# Patient Record
Sex: Female | Born: 1970 | Race: Black or African American | Hispanic: No | Marital: Single | Smoking: Former smoker
Health system: Southern US, Community
[De-identification: ages and names within clinical notes are randomized; demographics above are authoritative.]

## PROBLEM LIST (undated history)

## (undated) DIAGNOSIS — E039 Hypothyroidism, unspecified: Secondary | ICD-10-CM

## (undated) DIAGNOSIS — L309 Dermatitis, unspecified: Secondary | ICD-10-CM

## (undated) DIAGNOSIS — J302 Other seasonal allergic rhinitis: Secondary | ICD-10-CM

## (undated) DIAGNOSIS — O039 Complete or unspecified spontaneous abortion without complication: Secondary | ICD-10-CM

## (undated) DIAGNOSIS — L659 Nonscarring hair loss, unspecified: Secondary | ICD-10-CM

## (undated) DIAGNOSIS — D219 Benign neoplasm of connective and other soft tissue, unspecified: Secondary | ICD-10-CM

## (undated) HISTORY — DX: Dermatitis, unspecified: L30.9

## (undated) HISTORY — DX: Other seasonal allergic rhinitis: J30.2

## (undated) HISTORY — DX: Nonscarring hair loss, unspecified: L65.9

## (undated) HISTORY — DX: Hypothyroidism, unspecified: E03.9

## (undated) HISTORY — DX: Benign neoplasm of connective and other soft tissue, unspecified: D21.9

---

## 2008-09-20 ENCOUNTER — Other Ambulatory Visit: Payer: Self-pay | Admitting: Emergency Medicine

## 2008-09-21 ENCOUNTER — Inpatient Hospital Stay (HOSPITAL_COMMUNITY): Admission: AD | Admit: 2008-09-21 | Discharge: 2008-09-21 | Payer: Self-pay | Admitting: Obstetrics & Gynecology

## 2008-09-26 ENCOUNTER — Inpatient Hospital Stay (HOSPITAL_COMMUNITY): Admission: AD | Admit: 2008-09-26 | Discharge: 2008-09-26 | Payer: Self-pay | Admitting: Obstetrics & Gynecology

## 2008-09-26 ENCOUNTER — Inpatient Hospital Stay (HOSPITAL_COMMUNITY): Admission: AD | Admit: 2008-09-26 | Discharge: 2008-09-27 | Payer: Self-pay | Admitting: Obstetrics & Gynecology

## 2009-07-28 ENCOUNTER — Inpatient Hospital Stay (HOSPITAL_COMMUNITY): Admission: AD | Admit: 2009-07-28 | Discharge: 2009-07-28 | Payer: Self-pay | Admitting: Obstetrics & Gynecology

## 2009-07-28 ENCOUNTER — Ambulatory Visit: Payer: Self-pay | Admitting: Gynecology

## 2009-07-30 ENCOUNTER — Inpatient Hospital Stay (HOSPITAL_COMMUNITY): Admission: AD | Admit: 2009-07-30 | Discharge: 2009-07-30 | Payer: Self-pay | Admitting: Obstetrics & Gynecology

## 2009-07-30 ENCOUNTER — Ambulatory Visit: Payer: Self-pay | Admitting: Gynecology

## 2010-05-02 LAB — HCG, QUANTITATIVE, PREGNANCY: hCG, Beta Chain, Quant, S: 119 m[IU]/mL — ABNORMAL HIGH (ref <5)

## 2010-05-03 LAB — WET PREP, GENITAL
Trich, Wet Prep: NONE SEEN
Yeast Wet Prep HPF POC: NONE SEEN

## 2010-05-03 LAB — CBC: MCHC: 31.9 g/dL (ref 30.0–36.0)

## 2010-05-03 LAB — URINALYSIS, ROUTINE W REFLEX MICROSCOPIC
Leukocytes, UA: NEGATIVE
Nitrite: NEGATIVE
Urobilinogen, UA: 0.2 mg/dL (ref 0.0–1.0)

## 2010-05-03 LAB — GC/CHLAMYDIA PROBE AMP, GENITAL
Chlamydia, DNA Probe: NEGATIVE
GC Probe Amp, Genital: NEGATIVE

## 2010-05-23 LAB — HCG, QUANTITATIVE, PREGNANCY
hCG, Beta Chain, Quant, S: 10430 m[IU]/mL — ABNORMAL HIGH (ref <5)
hCG, Beta Chain, Quant, S: 5785 m[IU]/mL — ABNORMAL HIGH (ref <5)

## 2010-05-23 LAB — GC/CHLAMYDIA PROBE AMP, GENITAL
Chlamydia, DNA Probe: NEGATIVE
GC Probe Amp, Genital: NEGATIVE

## 2010-05-23 LAB — URINALYSIS, ROUTINE W REFLEX MICROSCOPIC
Bilirubin Urine: NEGATIVE
Glucose, UA: NEGATIVE mg/dL
Ketones, ur: NEGATIVE mg/dL
Nitrite: NEGATIVE
pH: 6 (ref 5.0–8.0)

## 2010-05-23 LAB — URINE MICROSCOPIC-ADD ON

## 2010-05-23 LAB — PREGNANCY, URINE: Preg Test, Ur: POSITIVE

## 2010-05-23 LAB — WET PREP, GENITAL
Trich, Wet Prep: NONE SEEN
Yeast Wet Prep HPF POC: NONE SEEN

## 2010-05-23 LAB — CBC
HCT: 35.8 % — ABNORMAL LOW (ref 36.0–46.0)
MCHC: 32.2 g/dL (ref 30.0–36.0)
RBC: 4.66 MIL/uL (ref 3.87–5.11)
WBC: 8.6 10*3/uL (ref 4.0–10.5)

## 2010-05-23 LAB — ABO/RH: ABO/RH(D): B POS

## 2011-09-11 ENCOUNTER — Encounter (HOSPITAL_BASED_OUTPATIENT_CLINIC_OR_DEPARTMENT_OTHER): Payer: Self-pay | Admitting: Emergency Medicine

## 2011-09-11 ENCOUNTER — Emergency Department (HOSPITAL_BASED_OUTPATIENT_CLINIC_OR_DEPARTMENT_OTHER)
Admission: EM | Admit: 2011-09-11 | Discharge: 2011-09-11 | Disposition: A | Discharge status: Home / Self Care | Payer: Medicaid Other | Attending: Emergency Medicine | Admitting: Emergency Medicine

## 2011-09-11 ENCOUNTER — Emergency Department (HOSPITAL_BASED_OUTPATIENT_CLINIC_OR_DEPARTMENT_OTHER): Payer: Medicaid Other

## 2011-09-11 DIAGNOSIS — R109 Unspecified abdominal pain: Secondary | ICD-10-CM | POA: Insufficient documentation

## 2011-09-11 DIAGNOSIS — O039 Complete or unspecified spontaneous abortion without complication: Secondary | ICD-10-CM | POA: Insufficient documentation

## 2011-09-11 DIAGNOSIS — R10819 Abdominal tenderness, unspecified site: Secondary | ICD-10-CM | POA: Insufficient documentation

## 2011-09-11 HISTORY — DX: Complete or unspecified spontaneous abortion without complication: O03.9

## 2011-09-11 LAB — CBC WITH DIFFERENTIAL/PLATELET
Eosinophils Relative: 1 % (ref 0–5)
HCT: 35.3 % — ABNORMAL LOW (ref 36.0–46.0)
Lymphocytes Relative: 16 % (ref 12–46)
Lymphs Abs: 1.4 10*3/uL (ref 0.7–4.0)
MCV: 77.1 fL — ABNORMAL LOW (ref 78.0–100.0)
Monocytes Absolute: 0.8 10*3/uL (ref 0.1–1.0)
Neutro Abs: 6.6 10*3/uL (ref 1.7–7.7)
Platelets: 251 10*3/uL (ref 150–400)
RBC: 4.58 MIL/uL (ref 3.87–5.11)
WBC: 8.8 10*3/uL (ref 4.0–10.5)

## 2011-09-11 LAB — WET PREP, GENITAL
Trich, Wet Prep: NONE SEEN
Yeast Wet Prep HPF POC: NONE SEEN

## 2011-09-11 LAB — BASIC METABOLIC PANEL
CO2: 26 mEq/L (ref 19–32)
Calcium: 9.2 mg/dL (ref 8.4–10.5)
Chloride: 101 mEq/L (ref 96–112)
Glucose, Bld: 111 mg/dL — ABNORMAL HIGH (ref 70–99)
Sodium: 137 mEq/L (ref 135–145)

## 2011-09-11 LAB — URINALYSIS, ROUTINE W REFLEX MICROSCOPIC
Specific Gravity, Urine: 1.036 — ABNORMAL HIGH (ref 1.005–1.030)
Urobilinogen, UA: 1 mg/dL (ref 0.0–1.0)
pH: 5.5 (ref 5.0–8.0)

## 2011-09-11 LAB — URINE MICROSCOPIC-ADD ON

## 2011-09-11 LAB — PREGNANCY, URINE: Preg Test, Ur: POSITIVE — AB

## 2011-09-11 MED ORDER — TRAMADOL HCL 50 MG PO TABS
50.0000 mg | ORAL_TABLET | Freq: Once | ORAL | Status: AC
  Administered 2011-09-11: 50 mg via ORAL
  Filled 2011-09-11: qty 1

## 2011-09-11 MED ORDER — OXYCODONE-ACETAMINOPHEN 5-325 MG PO TABS: 1.0000 | ORAL_TABLET | ORAL | Status: AC | PRN

## 2011-09-11 MED ORDER — METRONIDAZOLE 500 MG PO TABS: 500.0000 mg | ORAL_TABLET | Freq: Three times a day (TID) | ORAL | Status: AC

## 2011-09-11 MED ORDER — CIPROFLOXACIN HCL 500 MG PO TABS: 500.0000 mg | ORAL_TABLET | Freq: Two times a day (BID) | ORAL | Status: AC

## 2011-09-11 MED ORDER — ACETAMINOPHEN 325 MG PO TABS
650.0000 mg | ORAL_TABLET | Freq: Once | ORAL | Status: AC
  Administered 2011-09-11: 650 mg via ORAL
  Filled 2011-09-11: qty 2

## 2011-09-11 MED ORDER — FLUCONAZOLE 200 MG PO TABS: 200.0000 mg | ORAL_TABLET | Freq: Every day | ORAL | Status: AC

## 2011-09-11 MED ORDER — IOHEXOL 300 MG/ML  SOLN
100.0000 mL | Freq: Once | INTRAMUSCULAR | Status: AC | PRN
  Administered 2011-09-11: 100 mL via INTRAVENOUS

## 2011-09-11 MED ORDER — IOHEXOL 300 MG/ML  SOLN: 20.0000 mL | INTRAMUSCULAR | Status: AC

## 2011-09-11 NOTE — ED Notes (Signed)
Pt states she thinks she may have had a miscarriage.  Pt believes she is approximately 7 weeks.  No prenatal care yet, visit August 6.  Pt has been bleeding x 7 days, started as pink discharge, and has progressively turned to maroon red.  Pt only using one pad a day.  Pt believes she passed tissue on Wed or Fri.  Pt states she is having some abdominal pain.

## 2011-09-11 NOTE — ED Provider Notes (Signed)
History     CSN: [id number]  Arrival date & time 09/11/11  1123   First MD Initiated Contact with Patient 09/11/11 1144      Chief Complaint  Patient presents with  . Miscarriage    (Consider location/radiation/quality/duration/timing/severity/associated sxs/prior treatment) HPI Patient is a 41 year old [id number] with last menstrual period of June 4 who presents today complaining of one week of vaginal bleeding with passage of "fibrous-appearing" tissue 4 days ago and onset of bilateral lower quadrant abdominal pain 2 days ago. Patient has been taking Tylenol at home without relief. She is feeling better today but reported she did not come yesterday because she was hurting too bad to even come in. Patient denies any nausea or vomiting with this. She denies any constipation or diarrhea as well for any urinary symptoms. Patient is having continued vaginal bleeding. She has history of uterine fibroids but normally does not have pain with these and denies normal he even having significant cramping with her periods. Patient has no history of ectopic pregnancy and has had 2 prior spontaneous abortions in the first trimester. Patient denies any fevers, chills, or night sweats. 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 Patient had an upcoming appointment scheduled with [facility] for August 6 for first prenatal appointment but currently is not being followed by an OB/GYN. There are no other associated or modifying factors. Past Medical History  Diagnosis Date  . Miscarriage     History reviewed. No pertinent past surgical history.  History reviewed. No pertinent family history.  History  Substance Use Topics  . Smoking status: Not on file  . Smokeless tobacco: Not on file  . Alcohol Use:     OB History    Grav Para Term Preterm Abortions TAB SAB Ect Mult Living                  Review of  Systems  Constitutional: Negative.   HENT: Negative.   Eyes: Negative.   Respiratory: Negative.   Cardiovascular: Negative.   Gastrointestinal: Positive for abdominal pain.  Genitourinary: Positive for vaginal bleeding.  Musculoskeletal: Negative.   Skin: Negative.   Neurological: Negative.   Hematological: Negative.   Psychiatric/Behavioral: Negative.   All other systems reviewed and are negative.    Allergies  Review of patient's allergies indicates no known allergies.  Home Medications  No current outpatient prescriptions on file.  BP 127/67  Pulse 56  Temp 98.2 F (36.8 C) (Oral)  Resp 18  Ht 5\' 5"  (1.651 m)  Wt 168 lb (76.204 kg)  BMI 27.96 kg/m2  SpO2 100%  LMP 07/19/2011  Physical Exam  Nursing note and vitals reviewed. GEN: Well-developed, well-nourished female in no distress HEENT: Atraumatic, normocephalic. Oropharynx clear without erythema EYES: PERRLA BL, no scleral icterus. NECK: Trachea midline, no meningismus CV: regular rate and rhythm. No murmurs, rubs, or gallops PULM: No respiratory distress.  No crackles, wheezes, or rales. GI: soft, bilateral lower quadrant tenderness to palpation.. No guarding, rebound. + bowel sounds  GU: Speculum exam with moderate amount of vaginal bleeding noted. Several blood clots but no findings consistent with products of conception were noted. Patient no cervical motion tenderness. Patient was tender on palpation of the adnexa bilaterally but this is the same location where she was tender on palpation of the abdomen. Cervix was closed. Neuro: cranial nerves grossly 2-12 intact, no abnormalities of strength or sensation,  A and O x 3 MSK: Patient moves all 4 extremities symmetrically, no deformity, edema, or injury noted Skin: No rashes petechiae, purpura, or jaundice Psych: no abnormality of mood   ED Course  Procedures (including critical care time)  Labs Reviewed  PREGNANCY, URINE - Abnormal; Notable for the  following:    Preg Test, Ur POSITIVE (*)     All other components within normal limits  URINALYSIS, ROUTINE W REFLEX MICROSCOPIC - Abnormal; Notable for the following:    Color, Urine RED (*)  BIOCHEMICALS MAY BE AFFECTED BY COLOR   APPearance CLOUDY (*)     Specific Gravity, Urine 1.036 (*)     Hgb urine dipstick LARGE (*)     Bilirubin Urine SMALL (*)     Ketones, ur 15 (*)     Protein, ur 30 (*)     Leukocytes, UA SMALL (*)     All other components within normal limits  CBC WITH DIFFERENTIAL - Abnormal; Notable for the following:    Hemoglobin 11.8 (*)     HCT 35.3 (*)     MCV 77.1 (*)     MCH 25.8 (*)     RDW 16.7 (*)     All other components within normal limits  BASIC METABOLIC PANEL - Abnormal; Notable for the following:    Potassium 3.4 (*)     Glucose, Bld 111 (*)     All other components within normal limits  HCG, QUANTITATIVE, PREGNANCY - Abnormal; Notable for the following:    hCG, Beta Chain, Quant, S 45 (*)     All other components within normal limits  WET PREP, GENITAL - Abnormal; Notable for the following:    Clue Cells Wet Prep HPF POC FEW (*)     WBC, Wet Prep HPF POC FEW (*)     All other components within normal limits  URINE MICROSCOPIC-ADD ON - Abnormal; Notable for the following:    Squamous Epithelial / LPF FEW (*)     Bacteria, UA MANY (*)     All other components within normal limits  GC/CHLAMYDIA PROBE AMP, GENITAL   US Ob Comp Less 14 Wks  09/11/2011  *RADIOLOGY REPORT*  Clinical Data: 41 year old female with positive pregnancy test and vaginal bleeding.  Beta HCG of 45.  OBSTETRIC <14 WK [place] AND TRANSVAGINAL OB US  Technique:  Both transabdominal and transvaginal ultrasound examinations were performed for complete evaluation of the gestation as well as the maternal uterus, adnexal regions, and pelvic cul-de-sac.  Transvaginal technique was performed to assess early pregnancy.  Comparison:  07/28/2009 and prior OB ultrasounds.  Intrauterine  gestational sac:  Not visualized Yolk sac: Not visualized Embryo: Not visualized  Maternal uterus/adnexae: The uterus is retroflexed.  There are multiple uterine fibroids again noted most which are intramural.  The largest fibroid measures 2.2 x 2.8 cm in the posterior body.  The ovaries bilaterally are unremarkable. There is no evidence of adnexal mass. A small amount of complex fluid superior to the uterus is identified and may represent blood.  IMPRESSION: No evidence of intrauterine pregnancy.  Small amount of complex fluid versus blood superior to the uterus. In light of the positive pregnancy test and no evidence of intrauterine pregnancy, ectopic pregnancy is not entirely excluded, but no adnexal mass is identified.  Recommend close ultrasound, clinical  and / or laboratory follow-up.  Multiple uterine fibroids.  Original Report Authenticated By: Rosendo Gros, M.D.   US Ob Transvaginal  09/11/2011  *  RADIOLOGY REPORT*  Clinical Data: 41 year old female with positive pregnancy test and vaginal bleeding.  Beta HCG of 45.  OBSTETRIC <14 WK [place] AND TRANSVAGINAL OB US  Technique:  Both transabdominal and transvaginal ultrasound examinations were performed for complete evaluation of the gestation as well as the maternal uterus, adnexal regions, and pelvic cul-de-sac.  Transvaginal technique was performed to assess early pregnancy.  Comparison:  07/28/2009 and prior OB ultrasounds.  Intrauterine gestational sac:  Not visualized Yolk sac: Not visualized Embryo: Not visualized  Maternal uterus/adnexae: The uterus is retroflexed.  There are multiple uterine fibroids again noted most which are intramural.  The largest fibroid measures 2.2 x 2.8 cm in the posterior body.  The ovaries bilaterally are unremarkable. There is no evidence of adnexal mass. A small amount of complex fluid superior to the uterus is identified and may represent blood.  IMPRESSION: No evidence of intrauterine pregnancy.  Small amount of complex  fluid versus blood superior to the uterus. In light of the positive pregnancy test and no evidence of intrauterine pregnancy, ectopic pregnancy is not entirely excluded, but no adnexal mass is identified.  Recommend close ultrasound, clinical  and / or laboratory follow-up.  Multiple uterine fibroids.  Original Report Authenticated By: Rosendo Gros, M.D.     1. Complete miscarriage   2. Abdominal pain       MDM  Patient was evaluated by myself. Based on evaluation patient had workup for possible ectopic pregnancy. Urine pregnancy was positive and findings consistent with vaginal bleeding were noted on urinalysis. Patient did have samples collected on pelvic exam and cervix was noted to be closed. Patient had quantitative beta hCG that was 45. Transvaginal ultrasound was remarkable for a small fluid collection superior to the uterus. Radiology noted that this could be concerning for ectopic given history but that this seems an unlikely location.  Patient also was noted to have clue cells on wet prep. I discussed the patient with Dr. Jackelyn Knife who was on for gynecology. He agreed that the likelihood of this collection being an ectopic was low and recommended follow up in 48 hours with an OB/GYN for repeat labs. Patient was noted to have pain again on reassessment. She wanted only Tylenol for her pain today. We discussed that as this was very atypical for the patient that a CAT scan seemed reasonable to rule out any other dangerous causes of abdominal pain. Patient has no prior surgical history. She was quite tender on my exam. Additionally tenderness did not begin until 2 days ago despite the fact that patient had had vaginal bleeding for over a week. CT scan has been ordered to rule out any other possible causes of the patient's abdominal pain. This will be followed up by oncoming physician. If this returns within normal limits patient can be discharged. She would like only tramadol for pain per her  request. She can followup with [facility] as previously mentioned.        Cyndra Numbers, MD 09/11/11 1547

## 2011-09-11 NOTE — ED Provider Notes (Signed)
Assumed care from Dr Alto Denver in sign out. Unfortunately CT doesn't add much additional information. Ectopic not excluded but feel less given that pt specifically remembers passing what appeared to be tissue. Will give course of cipro/flagyl which will cover for possible colitis, possible UTI and BV. Pt has remained HD stable throughout 8 hour ED stay. Repeat abdominal exam prior to DC with no peritoneal signs. I feel she is safe for discharge at this time, but strict return precautions discussed. She is in need of close ob/gyn fu.   US Ob Comp Less 14 Wks  09/11/2011  *RADIOLOGY REPORT*  Clinical Data: 41 year old female with positive pregnancy test and vaginal bleeding.  Beta HCG of 45.  OBSTETRIC <14 WK [place] AND TRANSVAGINAL OB US  Technique:  Both transabdominal and transvaginal ultrasound examinations were performed for complete evaluation of the gestation as well as the maternal uterus, adnexal regions, and pelvic cul-de-sac.  Transvaginal technique was performed to assess early pregnancy.  Comparison:  07/28/2009 and prior OB ultrasounds.  Intrauterine gestational sac:  Not visualized Yolk sac: Not visualized Embryo: Not visualized  Maternal uterus/adnexae: The uterus is retroflexed.  There are multiple uterine fibroids again noted most which are intramural.  The largest fibroid measures 2.2 x 2.8 cm in the posterior body.  The ovaries bilaterally are unremarkable. There is no evidence of adnexal mass. A small amount of complex fluid superior to the uterus is identified and may represent blood.  IMPRESSION: No evidence of intrauterine pregnancy.  Small amount of complex fluid versus blood superior to the uterus. In light of the positive pregnancy test and no evidence of intrauterine pregnancy, ectopic pregnancy is not entirely excluded, but no adnexal mass is identified.  Recommend close ultrasound, clinical  and / or laboratory follow-up.  Multiple uterine fibroids.  Original Report Authenticated By: Rosendo Gros, M.D.   US Ob Transvaginal  09/11/2011  *RADIOLOGY REPORT*  Clinical Data: 41 year old female with positive pregnancy test and vaginal bleeding.  Beta HCG of 45.  OBSTETRIC <14 WK [place] AND TRANSVAGINAL OB US  Technique:  Both transabdominal and transvaginal ultrasound examinations were performed for complete evaluation of the gestation as well as the maternal uterus, adnexal regions, and pelvic cul-de-sac.  Transvaginal technique was performed to assess early pregnancy.  Comparison:  07/28/2009 and prior OB ultrasounds.  Intrauterine gestational sac:  Not visualized Yolk sac: Not visualized Embryo: Not visualized  Maternal uterus/adnexae: The uterus is retroflexed.  There are multiple uterine fibroids again noted most which are intramural.  The largest fibroid measures 2.2 x 2.8 cm in the posterior body.  The ovaries bilaterally are unremarkable. There is no evidence of adnexal mass. A small amount of complex fluid superior to the uterus is identified and may represent blood.  IMPRESSION: No evidence of intrauterine pregnancy.  Small amount of complex fluid versus blood superior to the uterus. In light of the positive pregnancy test and no evidence of intrauterine pregnancy, ectopic pregnancy is not entirely excluded, but no adnexal mass is identified.  Recommend close ultrasound, clinical  and / or laboratory follow-up.  Multiple uterine fibroids.  Original Report Authenticated By: Rosendo Gros, M.D.   Ct Abdomen Pelvis W Contrast  09/11/2011  *RADIOLOGY REPORT*  Clinical Data: Bilateral lower quadrant tenderness for 2 days. Vaginal bleeding.  LMP June 4.  CT ABDOMEN AND PELVIS WITH CONTRAST  Technique:  Multidetector CT imaging of the abdomen and pelvis was performed following the standard protocol during bolus administration of intravenous contrast.  Contrast: OMNIPAQUE IOHEXOL 300 MG/ML  SOLN  Comparison: Pelvic ultrasound 09/11/2011  Findings: Images of the lung bases are unremarkable.  No focal  abnormality identified within the liver, spleen, adrenal glands, pancreas, or kidneys.  The gallbladder is present.  The stomach and small bowel loops have a normal appearance. The appendix is well seen and has a normal appearance.  There is a small amount of fluid within the pelvis, contiguous with the uterus, adnexal regions, and sigmoid colon.  The uterus is enlarged and globular, consistent with multiple fibroids as seen on ultrasound.  The colonic wall does not appear thickened but given the fluid surrounding the sigmoid colon, colitis is not entirely excluded.  No retroperitoneal or mesenteric adenopathy. No evidence for aortic aneurysm. Visualized osseous structures have a normal appearance.  IMPRESSION:  1.  Free pelvic fluid, contiguous with colon and adnexal regions. Follow-up is recommended per Dr. Si Gaul for evaluation of the pelvis. Ectopic pregnancy has not been excluded.  Colitis is not entirely excluded but is not favored, given the distribution of the fluid. 2.  No evidence for bowel obstruction, abscess.  Normal appendix.  Original Report Authenticated By: Patterson Hammersmith, M.D.   Raeford Razor, MD 09/11/11 [phone]

## 2011-09-11 NOTE — ED Notes (Signed)
Pelvic cart ready and setup for the doctor to use.

## 2011-09-12 LAB — GC/CHLAMYDIA PROBE AMP, GENITAL: GC Probe Amp, Genital: NEGATIVE

## 2016-03-11 ENCOUNTER — Ambulatory Visit (INDEPENDENT_AMBULATORY_CARE_PROVIDER_SITE_OTHER): Payer: BLUE CROSS/BLUE SHIELD | Admitting: Internal Medicine

## 2016-03-11 ENCOUNTER — Encounter: Payer: Self-pay | Admitting: Internal Medicine

## 2016-03-11 DIAGNOSIS — J302 Other seasonal allergic rhinitis: Secondary | ICD-10-CM

## 2016-03-11 DIAGNOSIS — Z8759 Personal history of other complications of pregnancy, childbirth and the puerperium: Secondary | ICD-10-CM | POA: Insufficient documentation

## 2016-03-11 DIAGNOSIS — E039 Hypothyroidism, unspecified: Secondary | ICD-10-CM

## 2016-03-11 DIAGNOSIS — L659 Nonscarring hair loss, unspecified: Secondary | ICD-10-CM | POA: Diagnosis not present

## 2016-03-11 DIAGNOSIS — L309 Dermatitis, unspecified: Secondary | ICD-10-CM | POA: Diagnosis not present

## 2016-03-11 DIAGNOSIS — D219 Benign neoplasm of connective and other soft tissue, unspecified: Secondary | ICD-10-CM | POA: Insufficient documentation

## 2016-03-11 DIAGNOSIS — J309 Allergic rhinitis, unspecified: Secondary | ICD-10-CM | POA: Insufficient documentation

## 2016-03-11 DIAGNOSIS — L308 Other specified dermatitis: Secondary | ICD-10-CM

## 2016-03-11 DIAGNOSIS — Z79899 Other long term (current) drug therapy: Secondary | ICD-10-CM

## 2016-03-11 DIAGNOSIS — D259 Leiomyoma of uterus, unspecified: Secondary | ICD-10-CM

## 2016-03-11 DIAGNOSIS — Z87891 Personal history of nicotine dependence: Secondary | ICD-10-CM

## 2016-03-11 MED ORDER — LEVOTHYROXINE SODIUM 75 MCG PO TABS: 75.0000 ug | ORAL_TABLET | Freq: Every day | ORAL | 3 refills | Status: DC

## 2016-03-11 NOTE — Assessment & Plan Note (Signed)
This problem is chronic and stable. It is managed by her gynecologist. She states she had a D&C on January 3 due to spotting. She has not had a recent CBC in Epic but I will be obtaining her recent lab results from her gynecologist.  PLAN : follow

## 2016-03-11 NOTE — Assessment & Plan Note (Signed)
This problem is chronic and stable. She has been able to get pregnant but has not been able to carry a fetus at term. She has seen an infertility specialist in the past. It was determined that her problem was hypothyroidism which has been treated. She is currently widowed. She has not yet discounted any of her options which would include in vitro fertilization.  Plan : Support her in any decision she makes

## 2016-03-11 NOTE — Assessment & Plan Note (Signed)
This problem is chronic and stable. She was diagnosed by hypothyroidism by an infertility specialist who she saw after having several miscarriages. She does not know what her thyroid testing showed at that time. She has been maintained on Levthyroxine 75 g since then without any change in her dose or symptomatology. She has been getting TSHs every 6 months through her gynecologist. We reviewed the symptoms of hypo-and hyper thyroidism. We discussed lengthening TSH checks to every year which she is agreeable to. I think this is reasonable considering her dose has been stable for years and her weight is also stable.  Assessment : Hypothyroidism well controlled  Plan : continue levothyroxine 75 micrograms daily TSH in about November Obtain most recent TSH results from November 2017

## 2016-03-11 NOTE — Assessment & Plan Note (Signed)
She sees a dermatologist who treats this and her alopecia.  Plan : follow

## 2016-03-11 NOTE — Patient Instructions (Signed)
1. See me in one year 2. Call if anything changes

## 2016-03-11 NOTE — Assessment & Plan Note (Addendum)
This problem is chronic and stable. She does have symptomatology but preferred not to be on a daily medicine. She uses over-the-counter Flonase as needed. She hasn't seen an allergist in the past and started allergy shots but did not complete the course.  Plan : flonase PN

## 2016-03-11 NOTE — Progress Notes (Signed)
   Subjective:    Patient ID: Angela Huerta, female    DOB: 23-Jul-1970, 46 y.o.   MRN: [id number]  HPI  Angela Huerta is here for new pt and to discuss hypothyroidism. Please see the A&P for the status of the pt's chronic medical problems.  ROS : per ROS section and in problem oriented charting. All other systems are negative.  PMHx, Soc hx, and / or Fam hx : Past medical history includes 1. Hypothyroidism diagnosed during an infertility workup 2. Fibroids nonoperative management 3. Remote tobacco use 4. Multiple spontaneous abortions 5. One elective abortion roughly 25 years ago 52. Eczema 7. Alopecia treated with steroid injections 8. Seasonal allergies   Social history : She smoked for a total of 6 years, 30 cigarettes per day. She quit over 15 years ago. She drinks only couple of alcoholic drinks per year. She works in [employer], also does nails, coaches basketball, and is walking all day. She typically only eats once a day in the evening and does not snack during the day. She only drinks water and occasional swelling tea. She runs 3 times a week for an hour each time in the early mornings. 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 She is widowed with her husband passing away in his early 46s due to uncontrolled and untreated hypertension leading to heart failure and a stroke.   Family history : Negative for colon cancer. Positive for breast cancer, pancreatic cancer, uterine cancer. Other details under family history.   Review of Systems  Constitutional: Negative for activity change, appetite change and unexpected weight change.  HENT: Negative for rhinorrhea and sneezing.   Eyes: Positive for itching.  Respiratory: Negative for cough and shortness of breath.   Cardiovascular: Negative for chest pain and leg swelling.  Gastrointestinal: Negative for constipation and diarrhea.  Genitourinary: Positive for menstrual problem.  Negative for dysuria and urgency.  Musculoskeletal: Positive for arthralgias. Negative for gait problem and myalgias.  Skin: Negative for color change and rash.  Allergic/Immunologic: Positive for environmental allergies.  Neurological: Positive for light-headedness. Negative for dizziness.  Psychiatric/Behavioral: Negative for dysphoric mood. The patient is not nervous/anxious.        Objective:   Physical Exam  Constitutional: She appears well-developed and well-nourished. No distress.  HENT:  Head: Normocephalic and atraumatic.  Right Ear: External ear normal.  Left Ear: External ear normal.  Nose: Nose normal.  Eyes: Conjunctivae and EOM are normal.  Neck: Normal range of motion. Neck supple. No thyromegaly present.  Cardiovascular: Normal rate, regular rhythm and normal heart sounds.   Pulmonary/Chest: Effort normal and breath sounds normal. No respiratory distress. She has no wheezes.  Musculoskeletal: Normal range of motion.  Trace lower extremity edema bilaterally  Lymphadenopathy:    She has no cervical adenopathy.  Skin: Skin is warm and dry. She is not diaphoretic. No erythema.  Psychiatric: She has a normal mood and affect. Her behavior is normal. Judgment and thought content normal.          Assessment & Plan:

## 2016-04-04 ENCOUNTER — Other Ambulatory Visit: Payer: Self-pay | Admitting: *Deleted

## 2016-04-04 MED ORDER — LEVOTHYROXINE SODIUM 75 MCG PO TABS: 75.0000 ug | ORAL_TABLET | Freq: Every day | ORAL | 3 refills | Status: DC

## 2016-04-04 NOTE — Telephone Encounter (Signed)
Any brand the pt prefers - hopefully what she was on prior

## 2016-04-04 NOTE — Telephone Encounter (Signed)
Received fax from pharmacy requesting if it's ok to switch brands back to [employer]. Thanks!

## 2017-03-16 ENCOUNTER — Ambulatory Visit (INDEPENDENT_AMBULATORY_CARE_PROVIDER_SITE_OTHER): Payer: BLUE CROSS/BLUE SHIELD | Admitting: Internal Medicine

## 2017-03-16 ENCOUNTER — Encounter: Payer: Self-pay | Admitting: Internal Medicine

## 2017-03-16 VITALS — BP 131/79 | HR 47 | Temp 98.2°F | Wt 192.4 lb

## 2017-03-16 DIAGNOSIS — E669 Obesity, unspecified: Secondary | ICD-10-CM | POA: Diagnosis not present

## 2017-03-16 DIAGNOSIS — Z79899 Other long term (current) drug therapy: Secondary | ICD-10-CM | POA: Diagnosis not present

## 2017-03-16 DIAGNOSIS — Z87891 Personal history of nicotine dependence: Secondary | ICD-10-CM | POA: Diagnosis not present

## 2017-03-16 DIAGNOSIS — Z79890 Hormone replacement therapy (postmenopausal): Secondary | ICD-10-CM

## 2017-03-16 DIAGNOSIS — L309 Dermatitis, unspecified: Secondary | ICD-10-CM | POA: Diagnosis not present

## 2017-03-16 DIAGNOSIS — Z6832 Body mass index (BMI) 32.0-32.9, adult: Secondary | ICD-10-CM

## 2017-03-16 DIAGNOSIS — D649 Anemia, unspecified: Secondary | ICD-10-CM | POA: Diagnosis not present

## 2017-03-16 DIAGNOSIS — E039 Hypothyroidism, unspecified: Secondary | ICD-10-CM

## 2017-03-16 DIAGNOSIS — R6889 Other general symptoms and signs: Secondary | ICD-10-CM

## 2017-03-16 DIAGNOSIS — J3089 Other allergic rhinitis: Secondary | ICD-10-CM

## 2017-03-16 DIAGNOSIS — Z0000 Encounter for general adult medical examination without abnormal findings: Secondary | ICD-10-CM | POA: Insufficient documentation

## 2017-03-16 DIAGNOSIS — L308 Other specified dermatitis: Secondary | ICD-10-CM

## 2017-03-16 LAB — GLUCOSE, CAPILLARY: Glucose-Capillary: 87 mg/dL (ref 65–99)

## 2017-03-16 LAB — POCT GLYCOSYLATED HEMOGLOBIN (HGB A1C): Hemoglobin A1C: 5.5

## 2017-03-16 MED ORDER — TRIAMCINOLONE ACETONIDE 0.1 % EX CREA: 1.0000 "application " | TOPICAL_CREAM | Freq: Two times a day (BID) | CUTANEOUS | 3 refills | Status: DC

## 2017-03-16 MED ORDER — LEVOTHYROXINE SODIUM 75 MCG PO TABS: 75.0000 ug | ORAL_TABLET | Freq: Every day | ORAL | 3 refills | Status: DC

## 2017-03-16 NOTE — Assessment & Plan Note (Signed)
This problem is chronic and now worse. Some congestion, sneezing, and rhinorrhea. Uses Flonase PRN. Using nettie pot and helping a lot.  PLAN : cont meds

## 2017-03-16 NOTE — Assessment & Plan Note (Signed)
This problem is chronic and stable. She has some vauge sxs of thyroid abnl but has other reasons for the sxs - eczema, busy, constantly running. On synthroid 75 mcg.  PLAN : TSH today

## 2017-03-16 NOTE — Assessment & Plan Note (Signed)
She leads a very physically active life. I do not think her BMI represents her overall health. She is in agreement to check A1C.

## 2017-03-16 NOTE — Assessment & Plan Note (Signed)
This problem is chronic and a bit worse due to dry weather. Had seen derm and dx eczema. Using triamcinolone cream 0.1% PRN and helps a bit. Skin mostly over chest and back. Uses coconut oil to moisturize and sometimes gold bond. Uses 6 15gm tubes per year.  PLAN : refilled triamcinolone 0.1% cream 30 g with refills

## 2017-03-16 NOTE — Assessment & Plan Note (Signed)
She will think about Tdap and return if she desires. We discussed side effects.  Refused flu vaccine.

## 2017-03-16 NOTE — Patient Instructions (Signed)
1. I filled your cream and thyroid medicine 2. I will mail you your labs 3. Come back if you want a tetanus and pertussis vaccine

## 2017-03-16 NOTE — Progress Notes (Signed)
   Subjective:    Patient ID: Angela Huerta, female    DOB: 1970-04-27, 47 y.o.   MRN: [id number]  HPI  Angela Huerta is here for hypothyroidism F/U. Please see the A&P for the status of the pt's chronic medical problems.  ROS : per ROS section and in problem oriented charting. All other systems are negative.  PMHx, Soc hx, and / or Fam hx : Still coaching basket ball and running. DM in grandfather but not first degree relatives.  Review of Systems  Constitutional:       Feels hot at night  HENT: Positive for congestion and sinus pressure. Negative for rhinorrhea and sneezing.   Eyes: Positive for discharge.       Watery  Respiratory: Negative for cough and shortness of breath.   Gastrointestinal: Negative for abdominal pain.  Genitourinary: Negative for menstrual problem.  Skin:       Dry itchy skin       Objective:   Physical Exam  Constitutional: She appears well-developed and well-nourished. No distress.  HENT:  Head: Normocephalic and atraumatic.  Right Ear: External ear normal.  Left Ear: External ear normal.  Nose: Nose normal.  Eyes: Conjunctivae and EOM are normal. Right eye exhibits no discharge. Left eye exhibits no discharge. No scleral icterus.  Neck: Neck supple. No thyromegaly present.  Cardiovascular: Normal rate, regular rhythm and normal heart sounds.  No murmur heard. Pulmonary/Chest: Effort normal and breath sounds normal. No respiratory distress.  Musculoskeletal: She exhibits no tenderness.  Trace edema B  Lymphadenopathy:    She has no cervical adenopathy.  Skin: Skin is warm and dry. She is not diaphoretic.  Psychiatric: She has a normal mood and affect. Her behavior is normal. Judgment and thought content normal.      Assessment & Plan:

## 2017-03-16 NOTE — Assessment & Plan Note (Signed)
This problem is new. She has a h/o anemia. HgB 2013 was 11.8. MCV 77, RDW 16.7. Gyn had Rxd FeSO4 but she is not taking. No sxs but has always eaten ice. We discussed testing and iron infusion if ferritin very low.  PLAN : CBC, ferritin, B 12 (RDW high)

## 2017-03-17 LAB — CBC
HEMATOCRIT: 29 % — AB (ref 34.0–46.6)
Hemoglobin: 8.4 g/dL — ABNORMAL LOW (ref 11.1–15.9)
MCH: 19.7 pg — ABNORMAL LOW (ref 26.6–33.0)
MCHC: 29 g/dL — ABNORMAL LOW (ref 31.5–35.7)
MCV: 68 fL — AB (ref 79–97)
Platelets: 297 10*3/uL (ref 150–379)
RBC: 4.27 x10E6/uL (ref 3.77–5.28)
RDW: 17.9 % — ABNORMAL HIGH (ref 12.3–15.4)
WBC: 4.2 10*3/uL (ref 3.4–10.8)

## 2017-03-17 LAB — BMP8+ANION GAP
Anion Gap: 12 mmol/L (ref 10.0–18.0)
BUN/Creatinine Ratio: 17 (ref 9–23)
BUN: 13 mg/dL (ref 6–24)
CHLORIDE: 106 mmol/L (ref 96–106)
CO2: 21 mmol/L (ref 20–29)
CREATININE: 0.75 mg/dL (ref 0.57–1.00)
Calcium: 8.9 mg/dL (ref 8.7–10.2)
GFR calc Af Amer: 111 mL/min/{1.73_m2} (ref >59)
GFR calc non Af Amer: 96 mL/min/{1.73_m2} (ref >59)
Glucose: 94 mg/dL (ref 65–99)
Potassium: 4.3 mmol/L (ref 3.5–5.2)
Sodium: 139 mmol/L (ref 134–144)

## 2017-03-17 LAB — FERRITIN: Ferritin: 6 ng/mL — ABNORMAL LOW (ref 15–150)

## 2017-03-17 LAB — TSH: TSH: 1.36 u[IU]/mL (ref 0.450–4.500)

## 2017-03-17 LAB — VITAMIN B12: VITAMIN B 12: 957 pg/mL (ref 232–1245)

## 2017-03-20 ENCOUNTER — Encounter: Payer: Self-pay | Admitting: Internal Medicine

## 2017-03-23 ENCOUNTER — Encounter: Payer: Self-pay | Admitting: Pharmacist

## 2017-03-23 DIAGNOSIS — D509 Iron deficiency anemia, unspecified: Secondary | ICD-10-CM | POA: Insufficient documentation

## 2017-04-03 ENCOUNTER — Telehealth: Payer: Self-pay | Admitting: Internal Medicine

## 2017-04-03 NOTE — Telephone Encounter (Signed)
Patient is wanting to speak with Dr.Butcher, patient is suppose to have an iron infusion, patient is stating that her insurance deb is 3000, pt will have to pay out of pocket

## 2017-04-04 NOTE — Telephone Encounter (Signed)
Called pt. Short stay is outpt infusion and must pay deductible. If clinic procedure, would be office copay. Will speak to Dr Darnell Level if would be clinic procedure at the [facility]. Told pt I would call her back.

## 2017-04-11 ENCOUNTER — Telehealth: Payer: Self-pay | Admitting: *Deleted

## 2017-04-11 NOTE — Telephone Encounter (Signed)
Fax from [employer] Levothyroxin 75 mcg tab MANUFACTURER BACKORDERED-CAN WE CHANGE LEVOTHYROXINE MANUFACTURER?

## 2017-04-12 NOTE — Telephone Encounter (Signed)
Needs to speak with a nurse about levothyroxine (SYNTHROID, LEVOTHROID) 75 MCG tablet. Please call pt back.

## 2017-04-12 NOTE — Telephone Encounter (Signed)
It is always best to stay with same manu factor for thyroid replacement due to inconstancies. Is she out? Can she go to another pharmacy to get? If no other option, I will switch but she will need to come in 6 weeks for TSH.

## 2017-04-12 NOTE — Telephone Encounter (Signed)
Spoke w/ pt, she is going to call her online pharm and see if they carry the brand she has been using, if they do she will go with them, if not she will be willing to change and come in for labs in 6 wks. She will call us back

## 2017-04-13 MED ORDER — LEVOTHYROXINE SODIUM 75 MCG PO TABS: 75.0000 ug | ORAL_TABLET | Freq: Every day | ORAL | 3 refills | Status: DC

## 2017-04-13 NOTE — Telephone Encounter (Signed)
She has checked with all the national pharmacies and all are on back order, she would like for you to change brand and she will come in 6 wks for labs

## 2017-04-13 NOTE — Telephone Encounter (Signed)
Done

## 2017-05-11 MED ORDER — FERROUS SULFATE 325 (65 FE) MG PO TABS: 325.0000 mg | ORAL_TABLET | Freq: Every day | ORAL | 3 refills | Status: DC

## 2017-05-11 NOTE — Telephone Encounter (Signed)
Message from [place]. No option to get IV iron without her paying the cost until she meets her ductible. Confirms what Dr Maudie Mercury, sharon, and Dr Darnell Level said. Sent in FeSO4 to [facility]. Would you pls let her know med sent in?

## 2017-05-11 NOTE — Progress Notes (Signed)
Finally was able to get in contact with the patient and the patient insurance company about this procedure and Co-Pay.  The patient is responsible for her yearly deductible.  After many discussions with the insurance nothing can be done about her high deductible.  After speaking to [facility] she will not be able to pay the deductible of $3,000.00 as she states she hardly ever goes to the Dr. And has not been seen but one time and she has not even begun to meet her deductible.  The patient would like to know now if you have any other options or can she start taking Iron pills.

## 2017-05-11 NOTE — Telephone Encounter (Signed)
Called pt - no answer; left message to give us a call back. 

## 2017-05-18 NOTE — Telephone Encounter (Signed)
Called pt to see if she had received iron rx - no answer; left message to call us back.

## 2017-07-07 ENCOUNTER — Telehealth: Payer: Self-pay | Admitting: *Deleted

## 2017-07-07 NOTE — Telephone Encounter (Signed)
Received fax from [place] requesting to switch brands of levothyroxine from [facility] to [facility]. Will route to PCP for consideration. Hubbard Hartshorn, RN, BSN

## 2017-07-11 NOTE — Telephone Encounter (Signed)
If they do not have her normal brand in stock, OK to switch.

## 2017-07-12 NOTE — Telephone Encounter (Signed)
Ok to switch if pt preference.

## 2017-07-12 NOTE — Telephone Encounter (Signed)
Spoke with Estill Bamberg at [facility]. States request to switch brands is due to cost to patient not stock availability. [facility] costs patient $54.64 and [employer] costs patient $10. Will route to PCP to ensure still ok to switch brands. Hubbard Hartshorn, RN, BSN

## 2017-07-12 NOTE — Telephone Encounter (Signed)
Estill Bamberg notified brand switch ok per patient preference. Hubbard Hartshorn, RN, BSN

## 2017-08-29 ENCOUNTER — Encounter (INDEPENDENT_AMBULATORY_CARE_PROVIDER_SITE_OTHER): Payer: Self-pay

## 2017-08-29 ENCOUNTER — Ambulatory Visit: Payer: BLUE CROSS/BLUE SHIELD | Admitting: Internal Medicine

## 2017-08-29 DIAGNOSIS — E039 Hypothyroidism, unspecified: Secondary | ICD-10-CM | POA: Diagnosis not present

## 2017-08-29 DIAGNOSIS — J302 Other seasonal allergic rhinitis: Secondary | ICD-10-CM | POA: Diagnosis not present

## 2017-08-29 DIAGNOSIS — J3089 Other allergic rhinitis: Secondary | ICD-10-CM

## 2017-08-29 NOTE — Patient Instructions (Signed)
It was nice seeing you today. Thank you for choosing [facility] for your Primary Care.   Today we talked about:  1) Ear pain, sinus pressure: Take zyrtec and flonase EVERY DAY. Go to a pharmacy and get pseudoephedrine (you can get the "good stuff" from behind the counter). You can take Aleve or Tylenol for pain.   FOLLOW-UP INSTRUCTIONS When: Thursday if symptoms are not better  Please contact the clinic if you have any problems, or need to be seen sooner.

## 2017-08-29 NOTE — Progress Notes (Signed)
   CC: ear pain  HPI:  [name] is a 47 y.o. female with hypothyroidism and seasonal allergies who presents for evaluation of left ear pain. She reports 6 days of left ear pain, headache, sinus pressure, water eyes, and waking up with puffy eyes. She also notes fatigue and dizziness when she bends over. Denies n/v/d, fevers, chills, sore throat. Takes Zyrtec daily and occasionally uses Flonase and Aleve. Has also used sweet oil in the left ear. She does not like to take many medicines. She is traveling to [facility] Friday.   Past Medical History:  Diagnosis Date  . Alopecia   . Eczema   . Fibroids   . Hypothyroidism    Dx as part of intertility W/U  . Miscarriage   . Seasonal allergies     Physical Exam:  Vitals:   08/29/17 1558  BP: (!) 149/87  Pulse: 80  Temp: 99 F (37.2 C)  TempSrc: Oral  SpO2: 100%  Weight: 185 lb (83.9 kg)   Gen: Well appearing, NAD ENT: OP clear without erythema or exudate. Several teeth with fillings. Tympanic membranes clear bilaterally with good light reflex. Nasal mucosa pink with clear mucous  Eyes: conjunctiva normal. No erythema or discharge Neck: No cervical LAD   Assessment & Plan:   See Encounters Tab for problem based charting.  Patient seen with Dr. Daryll Drown

## 2017-08-29 NOTE — Assessment & Plan Note (Signed)
Acute on chronic. Now worse. Uses Zyrtec. Occasional flonase and aleve.   Plan: - continue zyrtec and flonase daily  - OTC pseudoephedrine, Aleve - return in 2 days is symptoms not improving

## 2017-08-31 NOTE — Addendum Note (Signed)
Addended by: Gilles Chiquito B on: 08/31/2017 01:13 PM   Modules accepted: Level of Service

## 2017-08-31 NOTE — Progress Notes (Signed)
Internal Medicine Clinic Attending  I saw and evaluated the patient.  I personally confirmed the key portions of the history and exam documented by Dr. Vogel and I reviewed pertinent patient test results.  The assessment, diagnosis, and plan were formulated together and I agree with the documentation in the resident's note.  

## 2017-09-08 ENCOUNTER — Telehealth: Payer: Self-pay | Admitting: Internal Medicine

## 2018-03-08 ENCOUNTER — Ambulatory Visit (INDEPENDENT_AMBULATORY_CARE_PROVIDER_SITE_OTHER): Payer: BLUE CROSS/BLUE SHIELD | Admitting: Internal Medicine

## 2018-03-08 ENCOUNTER — Encounter (INDEPENDENT_AMBULATORY_CARE_PROVIDER_SITE_OTHER): Payer: Self-pay

## 2018-03-08 ENCOUNTER — Encounter: Payer: Self-pay | Admitting: Internal Medicine

## 2018-03-08 VITALS — BP 132/79 | HR 68 | Temp 98.8°F | Resp 20 | Wt 176.7 lb

## 2018-03-08 DIAGNOSIS — E039 Hypothyroidism, unspecified: Secondary | ICD-10-CM | POA: Diagnosis not present

## 2018-03-08 DIAGNOSIS — J309 Allergic rhinitis, unspecified: Secondary | ICD-10-CM | POA: Diagnosis not present

## 2018-03-08 DIAGNOSIS — Z6829 Body mass index (BMI) 29.0-29.9, adult: Secondary | ICD-10-CM

## 2018-03-08 DIAGNOSIS — Z0000 Encounter for general adult medical examination without abnormal findings: Secondary | ICD-10-CM

## 2018-03-08 DIAGNOSIS — Z79890 Hormone replacement therapy (postmenopausal): Secondary | ICD-10-CM

## 2018-03-08 DIAGNOSIS — D509 Iron deficiency anemia, unspecified: Secondary | ICD-10-CM

## 2018-03-08 DIAGNOSIS — L308 Other specified dermatitis: Secondary | ICD-10-CM

## 2018-03-08 DIAGNOSIS — J3089 Other allergic rhinitis: Secondary | ICD-10-CM

## 2018-03-08 DIAGNOSIS — L309 Dermatitis, unspecified: Secondary | ICD-10-CM | POA: Diagnosis not present

## 2018-03-08 DIAGNOSIS — E669 Obesity, unspecified: Secondary | ICD-10-CM

## 2018-03-08 DIAGNOSIS — Z79899 Other long term (current) drug therapy: Secondary | ICD-10-CM

## 2018-03-08 MED ORDER — LEVOTHYROXINE SODIUM 75 MCG PO TABS: 75.0000 ug | ORAL_TABLET | Freq: Every day | ORAL | 3 refills | Status: DC

## 2018-03-08 MED ORDER — TRIAMCINOLONE ACETONIDE 0.1 % EX CREA: 1.0000 "application " | TOPICAL_CREAM | Freq: Two times a day (BID) | CUTANEOUS | 3 refills | Status: DC

## 2018-03-08 NOTE — Assessment & Plan Note (Signed)
This problem is chronic and uncontrolled.  Her hemoglobin dropped from 11.8 6 years ago to 8.4 last year.  Last year, her MCV was 68, RDW 17.9, ferritin 6, and vitamin B12 1000.  She was not able to tolerate oral iron and we attempted to get IV iron infusion but her co-pay from her insurance was too high.  She has tried to take additional oral supplementation but it causes hard stools and infrequent stools and even at baseline, she does not have a bowel movement daily.  Therefore, she is not able to tolerate much oral iron.  I am checking a hemoglobin today but I see no reason to check a ferritin as she has not been repleted and therefore her ferritin is still likely going to be low.  We are going to attempt to get IV iron infusion that is going to depend on her co-pay.  PLAN : CBC Try to get IV iron infusion

## 2018-03-08 NOTE — Assessment & Plan Note (Signed)
This problem is chronic and stable.  She continues to use triamcinolone whenever she gets an outbreak.  She knows not to use this on her face.  PLAN:  Cont current meds

## 2018-03-08 NOTE — Assessment & Plan Note (Signed)
This problem is chronic and uncontrolled.  She is having a lot of PND, cough, rhinorrhea, and congestion as a result of a viral URI which she is recovering from.  She is still using her Nettie pot and Flonase as needed.  PLAN:  Cont current meds

## 2018-03-08 NOTE — Patient Instructions (Signed)
1. I refilled your medicines for one year 2. I will mail you your test results 3. I will order an iron infusion so you can see cost

## 2018-03-08 NOTE — Assessment & Plan Note (Signed)
She is not interested in a flu shot

## 2018-03-08 NOTE — Assessment & Plan Note (Signed)
This problem is chronic and improved.  She has lost 16 pounds in the past year.  While this is unintentional, she knows why she lost the weight.  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  She then later got a sinus or bronchitis infection and lost her appetite.  Additionally, she is referring a lot of basketball games which is increased her activity.  PLAN : follow

## 2018-03-08 NOTE — Progress Notes (Signed)
   Subjective:    Patient ID: Angela Huerta, female    DOB: 24-Jan-1971, 48 y.o.   MRN: [id number]  HPI  Angela Huerta is here for a preventative appointment. Please see the A&P for the status of the pt's chronic medical problems.  ROS : per ROS section and in problem oriented charting. All other systems are negative.  PMHx, Soc hx, and / or Fam hx : She recently went from part-time to full-time which expanded her healthcare benefits.  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   Review of Systems  Constitutional: Positive for activity change and appetite change. Negative for fever and unexpected weight change.  HENT: Positive for congestion, postnasal drip and rhinorrhea.   Eyes: Negative for visual disturbance.  Respiratory: Positive for cough. Negative for shortness of breath.   Cardiovascular: Negative for leg swelling.  Gastrointestinal: Negative for constipation and diarrhea.  Genitourinary: Negative for dysuria.  Musculoskeletal: Negative for arthralgias, back pain and myalgias.  Skin:       Intermittent eczema flares  Neurological: Negative for dizziness and headaches.  Psychiatric/Behavioral: Negative for sleep disturbance.       Objective:   Physical Exam Constitutional:      General: She is not in acute distress.    Appearance: Normal appearance. She is not ill-appearing, toxic-appearing or diaphoretic.  HENT:     Head: Normocephalic and atraumatic.     Right Ear: External ear normal.     Left Ear: External ear normal.     Nose: Congestion present. No rhinorrhea.  Eyes:     General: No scleral icterus.       Right eye: No discharge.        Left eye: No discharge.     Extraocular Movements: Extraocular movements intact.     Conjunctiva/sclera: Conjunctivae normal.  Neck:     Musculoskeletal: Normal range of motion and neck supple. No muscular tenderness.     Comments: No thyroid nodules or thyromegaly Cardiovascular:   Rate and Rhythm: Normal rate and regular rhythm.     Heart sounds: No murmur.  Pulmonary:     Effort: Pulmonary effort is normal.     Breath sounds: Normal breath sounds. No rhonchi or rales.  Musculoskeletal:        General: No swelling or tenderness.     Right lower leg: No edema.     Left lower leg: No edema.  Lymphadenopathy:     Cervical: No cervical adenopathy.  Skin:    General: Skin is warm and dry.  Neurological:     General: No focal deficit present.     Mental Status: She is alert. Mental status is at baseline.  Psychiatric:        Mood and Affect: Mood normal.        Behavior: Behavior normal.        Thought Content: Thought content normal.        Judgment: Judgment normal.           Assessment & Plan:

## 2018-03-08 NOTE — Assessment & Plan Note (Signed)
This problem is chronic and stable.  She denies any thyroid symptoms.  She had to switch manufacturers due to availability issues.  She is on 75 mcg a day.  I am checking a TSH today.  PLAN : TSH Continue Synthroid 75 mcg a day

## 2018-03-09 ENCOUNTER — Telehealth: Payer: Self-pay | Admitting: *Deleted

## 2018-03-09 LAB — TSH: TSH: 1.5 u[IU]/mL (ref 0.450–4.500)

## 2018-03-09 LAB — CBC
Hematocrit: 25.5 % — ABNORMAL LOW (ref 34.0–46.6)
Hemoglobin: 6.6 g/dL — CL (ref 11.1–15.9)
MCH: 15.6 pg — ABNORMAL LOW (ref 26.6–33.0)
MCHC: 25.9 g/dL — ABNORMAL LOW (ref 31.5–35.7)
MCV: 60 fL — ABNORMAL LOW (ref 79–97)
PLATELETS: 289 10*3/uL (ref 150–450)
RBC: 4.22 x10E6/uL (ref 3.77–5.28)
RDW: 19.6 % — ABNORMAL HIGH (ref 11.7–15.4)
WBC: 6.7 10*3/uL (ref 3.4–10.8)

## 2018-03-09 NOTE — Telephone Encounter (Signed)
Spoke w/ dr Software engineer, called admissions Direct admit Sunday 1/26 OBS REG bed Iron deficiency anemia Dr Daryll Drown admit Called spoke to [place] and robin in pt placement, complete for OBS direct for Sunday 1/26

## 2018-03-09 NOTE — Telephone Encounter (Signed)
Spoke w/ pt, informed her of plan for Sunday, she was given much reassurance and plan was repeated several times. She expressed worry of cost and "wanting to put it off", she was given data to support not putting off and given emotional encouragement. She agrees after talking for a period of time that this is in her best interest.

## 2018-03-09 NOTE — Telephone Encounter (Signed)
Received call from patient regarding admission on Sunday.  Pt states she does not understand why she needs to come in on Sunday for a blood transfusion and would rather be referred out to a hematologist. This RN provided education to patient on iron deficiency, pt verbalized understanding and states she will be at [facility] on Sunday.  Pt also states she does not know where to go for admission on Sunday.  Pt informed to wait at home and someone from admitting will call her Sunday when bed is available and give her instructions on where to present per admissions, she verbalized understanding.  Pt is requesting a referral to heme to be evaluated because she wants to know why her "bloodcount is so low".  Informed pt RN will send request to Dr. Lynnae January. Thank you, SChaplin, RN,BSN

## 2018-03-09 NOTE — Telephone Encounter (Signed)
Thanks. Will you call her this PM and let her know to wait at home for call Sun AM?

## 2018-03-09 NOTE — Telephone Encounter (Signed)
Will be calling her

## 2018-03-09 NOTE — Telephone Encounter (Signed)
Received critical lab value of 6.6 Hgb collected 1/23. Text page sent to Dr Lynnae January; also tried calling.

## 2018-03-09 NOTE — Telephone Encounter (Signed)
HGB 6.6 HCT 25.5 Platelets 289 RDW 19.6  Sent inbasket message

## 2018-03-11 ENCOUNTER — Observation Stay (HOSPITAL_COMMUNITY)
Admission: AD | Admit: 2018-03-11 | Discharge: 2018-03-12 | Disposition: A | Discharge status: Home / Self Care | Payer: BLUE CROSS/BLUE SHIELD | Source: Ambulatory Visit | Attending: Internal Medicine | Admitting: Internal Medicine

## 2018-03-11 DIAGNOSIS — Z79899 Other long term (current) drug therapy: Secondary | ICD-10-CM

## 2018-03-11 DIAGNOSIS — Z79890 Hormone replacement therapy (postmenopausal): Secondary | ICD-10-CM

## 2018-03-11 DIAGNOSIS — Z87891 Personal history of nicotine dependence: Secondary | ICD-10-CM

## 2018-03-11 DIAGNOSIS — D509 Iron deficiency anemia, unspecified: Secondary | ICD-10-CM | POA: Diagnosis present

## 2018-03-11 DIAGNOSIS — K5900 Constipation, unspecified: Secondary | ICD-10-CM | POA: Diagnosis present

## 2018-03-11 DIAGNOSIS — E039 Hypothyroidism, unspecified: Secondary | ICD-10-CM

## 2018-03-11 DIAGNOSIS — R42 Dizziness and giddiness: Secondary | ICD-10-CM | POA: Diagnosis present

## 2018-03-11 LAB — COMPREHENSIVE METABOLIC PANEL
ALT: 17 U/L (ref 0–44)
AST: 21 U/L (ref 15–41)
Albumin: 3.3 g/dL — ABNORMAL LOW (ref 3.5–5.0)
Alkaline Phosphatase: 80 U/L (ref 38–126)
Anion gap: 7 (ref 5–15)
BUN: 10 mg/dL (ref 6–20)
CHLORIDE: 107 mmol/L (ref 98–111)
CO2: 24 mmol/L (ref 22–32)
Calcium: 9.2 mg/dL (ref 8.9–10.3)
Creatinine, Ser: 0.82 mg/dL (ref 0.44–1.00)
GFR calc non Af Amer: >60 mL/min (ref >60)
Glucose, Bld: 98 mg/dL (ref 70–99)
Potassium: 3.8 mmol/L (ref 3.5–5.1)
SODIUM: 138 mmol/L (ref 135–145)
Total Bilirubin: 0.5 mg/dL (ref 0.3–1.2)
Total Protein: 7 g/dL (ref 6.5–8.1)

## 2018-03-11 LAB — CBC
HCT: 25 % — ABNORMAL LOW (ref 36.0–46.0)
Hemoglobin: 6.3 g/dL — CL (ref 12.0–15.0)
MCH: 15 pg — ABNORMAL LOW (ref 26.0–34.0)
MCHC: 25.2 g/dL — ABNORMAL LOW (ref 30.0–36.0)
MCV: 59.7 fL — ABNORMAL LOW (ref 80.0–100.0)
NRBC: 0 % (ref 0.0–0.2)
Platelets: 286 10*3/uL (ref 150–400)
RBC: 4.19 MIL/uL (ref 3.87–5.11)
RDW: 20.9 % — ABNORMAL HIGH (ref 11.5–15.5)
WBC: 5 10*3/uL (ref 4.0–10.5)

## 2018-03-11 LAB — ABO/RH: ABO/RH(D): B POS

## 2018-03-11 LAB — PREPARE RBC (CROSSMATCH)

## 2018-03-11 MED ORDER — ACETAMINOPHEN 325 MG PO TABS: 650.0000 mg | ORAL_TABLET | Freq: Four times a day (QID) | ORAL | Status: DC | PRN

## 2018-03-11 MED ORDER — SODIUM CHLORIDE 0.9 % IV SOLN
510.0000 mg | Freq: Once | INTRAVENOUS | Status: AC
  Administered 2018-03-11: 510 mg via INTRAVENOUS
  Filled 2018-03-11: qty 17

## 2018-03-11 MED ORDER — ONDANSETRON HCL 4 MG/2ML IJ SOLN: 4.0000 mg | Freq: Four times a day (QID) | INTRAMUSCULAR | Status: DC | PRN

## 2018-03-11 MED ORDER — ENOXAPARIN SODIUM 40 MG/0.4ML ~~LOC~~ SOLN
40.0000 mg | SUBCUTANEOUS | Status: DC
  Administered 2018-03-11: 40 mg via SUBCUTANEOUS
  Filled 2018-03-11: qty 0.4

## 2018-03-11 MED ORDER — ACETAMINOPHEN 650 MG RE SUPP: 650.0000 mg | Freq: Four times a day (QID) | RECTAL | Status: DC | PRN

## 2018-03-11 MED ORDER — LEVOTHYROXINE SODIUM 75 MCG PO TABS
75.0000 ug | ORAL_TABLET | Freq: Every day | ORAL | Status: DC
  Administered 2018-03-12: 75 ug via ORAL
  Filled 2018-03-11: qty 1

## 2018-03-11 MED ORDER — SODIUM CHLORIDE 0.9% IV SOLUTION
Freq: Once | INTRAVENOUS | Status: AC
  Administered 2018-03-11: 19:00:00 via INTRAVENOUS

## 2018-03-11 MED ORDER — ONDANSETRON HCL 4 MG PO TABS: 4.0000 mg | ORAL_TABLET | Freq: Four times a day (QID) | ORAL | Status: DC | PRN

## 2018-03-11 NOTE — Telephone Encounter (Signed)
Patient presented and was admitted by the IMTS team on call.

## 2018-03-11 NOTE — Progress Notes (Signed)
Pt came in as a direct admission this morning. No admission orders placed, MD paged

## 2018-03-11 NOTE — H&P (Signed)
Date: 03/11/2018               Patient Name:  Angela Huerta MRN: [id number]  DOB: 07/04/1970 Age / Sex: 48 y.o., female   PCP: Bartholomew Crews, MD         Medical Service: Internal Medicine Teaching Service         Attending Physician: Dr. Sid Falcon, MD    First Contact: Dr. Annie Paras Pager: [phone]  Second Contact: Dr. Tarri Abernethy Pager: [phone]       After Hours (After 5p/  First Contact Pager: [phone]  weekends / holidays): Second Contact Pager: [phone]   Chief Complaint: Anemia  History of Present Illness: Angela Huerta is a 48 yo female with a medical history of iron deficiency anemia and hypothyroidism who presents after being told she had a low hemoglobin. Patient went to see her PCP Dr. Lynnae January on Thursday and got some blood work. She was subsequently called, told her hemoglobin was 6.6, and asked to come to the hospital. She declined admission that day because she had other things to do. She does not think she is symptomatic and hypothesizes that this may be due to chronically living at low hemoglobin levels. 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 She occasionally has some dizziness upon standing and eats ice, but otherwise denies SOB, CP, visual changes. She does have constipation but denies black tarry stools or blood in her stools. She denies hematuria. She has never had a bowel surgery like gastric bypass. She has never had a colonoscopy. She tells [place] that she has never been told why she is anemic. She has been prescribed iron supplements in the past, but does not take them because they make her feel constipated.   She has had miscarriages (one elective, two spontaneous) in the past. She has no children. Her mother is also iron deficient, which the patient states is a result of uterine fibroids and heavy menses. Ms. Busser also has small fibroids, but denies excess bleeding during her menstrual cycle. She cycles every  21-28 days and bleeds for 4-5 day. She uses about 5 tampons per day on the heavy days and has about 3 heavy days per cycle. She denies recent changes in her menstrual cycle.   Denies new arthralgias, myalgias, recurrent red eye, oral ulcers, genital ulcers, rashes, hx of seizures, family history of autoimmune disease, pleurisy, hematuria, family hx of colon cancer. Father does have prostate cancer. Endorses constipation.   Meds: Only takes levothyroxine and vitamins. No other medications. Is prescribed iron but does not take them due to side effects.  Allergies: Allergies as of 03/09/2018  . (No Known Allergies)   Past Medical History:  Diagnosis Date  . Alopecia   . Eczema   . Fibroids   . Hypothyroidism    Dx as part of intertility W/U  . Miscarriage   . Seasonal allergies    Past surgical history: elective abortion at age 55  Family History:  Family History  Problem Relation Age of Onset  . Fibroids Mother   . Pancreatic cancer Maternal Grandfather   . Heart disease Paternal Grandmother   . Breast cancer Paternal Aunt   . Diabetes Paternal Aunt   . Colon cancer Neg Hx    Social History: Currently works 4 jobs, Curator. Former smoke, quit in 2000. Infrequent etoh use. Denies illicit drugs.  Review of Systems: A complete ROS was negative  except as per HPI.   Physical Exam: Blood pressure (!) 170/85, pulse 62, temperature 99.2 F (37.3 C), temperature source Oral, weight 79.4 kg, last menstrual period 03/06/2018, SpO2 100 %.  Constitutional: Well-developed, well-nourished, and in no distress.  HEENT: Pupils are equal, round, and reactive to light. EOM are normal. Conjunctival and gingival pallor. Cardiovascular: Normal rate and regular rhythm. No murmurs, rubs, or gallops. Pulmonary/Chest: Effort normal. Clear to auscultation bilaterally. No wheezes, rales, or rhonchi. Abdominal: Bowel sounds present. Soft, non-distended, non-tender. Ext: No lower  extremity edema. Skin: Warm and dry. No rashes or wounds.  Assessment & Plan by Problem: Active Problems:   Iron deficiency anemia  [name] is a 48 yo female with a medical history of iron deficiency anemia and hypothyroidism who presented as a direct admission for a Hb of 6.6 during outpatient lab work with her PCP on 1/23. She was admitted for further evaluation and management.  Iron Deficiency Anemia - Hb has progressively declined over the past six years from 11.8 to 6.3 today - Ferritin one year ago was decreased at 6. MCV today is 60. - Unable to tolerate oral iron 2/2 constipation. She was unable to afford IV iron infusions. - No signs or symptoms of bleeding. Menses are frequent, but not excessive. Patient has never had a colonoscopy, but one is not indicated until age 59 given her lack of family history. No signs or symptoms of celiac disease or H pylori to explain decreased iron.  Plan  - Transfuse 2u RBCs. Post-transfusion hemoglobin. - Feraheme - Repeat CBC in the am  Hypothyroidism: continue home synthroid  FEN: no IV fluids, regular diet, replace electrolytes as needed  DVT ppx: [place] Lovenox Code status: FULL code  Dispo: Admit patient to Observation with expected length of stay less than 2 midnights.  Signed: Corinne Ports, MD 03/11/2018, 2:01 PM   Pager: [phone]

## 2018-03-12 DIAGNOSIS — D509 Iron deficiency anemia, unspecified: Secondary | ICD-10-CM | POA: Diagnosis not present

## 2018-03-12 LAB — BPAM RBC
Blood Product Expiration Date: 202002202359
Blood Product Expiration Date: 202002212359
ISSUE DATE / TIME: 202001261626
ISSUE DATE / TIME: 202001262250
Unit Type and Rh: 7300
Unit Type and Rh: 7300

## 2018-03-12 LAB — TYPE AND SCREEN
ABO/RH(D): B POS
Antibody Screen: NEGATIVE
Unit division: 0
Unit division: 0

## 2018-03-12 LAB — CBC
HCT: 32.2 % — ABNORMAL LOW (ref 36.0–46.0)
Hemoglobin: 9.2 g/dL — ABNORMAL LOW (ref 12.0–15.0)
MCH: 18.5 pg — AB (ref 26.0–34.0)
MCHC: 28.6 g/dL — ABNORMAL LOW (ref 30.0–36.0)
MCV: 64.7 fL — ABNORMAL LOW (ref 80.0–100.0)
Platelets: 281 10*3/uL (ref 150–400)
RBC: 4.98 MIL/uL (ref 3.87–5.11)
RDW: 25.7 % — ABNORMAL HIGH (ref 11.5–15.5)
WBC: 6 10*3/uL (ref 4.0–10.5)
nRBC: 0 % (ref 0.0–0.2)

## 2018-03-12 NOTE — Discharge Planning (Signed)
Patient discharged home in stable condition. Verbalizes understanding of all discharge instructions, including home medications and follow up appointments. 

## 2018-03-12 NOTE — Discharge Summary (Signed)
Name: Angela Huerta MRN: [id number] DOB: 1970/10/30 48 y.o. PCP: Bartholomew Crews, MD  Date of Admission: 03/11/2018 10:40 AM Date of Discharge: 03/12/2018 Attending Physician: Dr. Rebeca Alert  Discharge Diagnosis: 1. Iron deficiency anemia  Discharge Medications: Allergies as of 03/12/2018   No Known Allergies     Medication List    TAKE these medications   ferrous sulfate 325 (65 FE) MG tablet Take 1 tablet (325 mg total) by mouth daily.   levothyroxine 75 MCG tablet Commonly known as:  SYNTHROID, LEVOTHROID Take 1 tablet (75 mcg total) by mouth daily before breakfast.   multivitamin with minerals Tabs tablet Take 1 tablet by mouth daily.   triamcinolone cream 0.1 % Commonly known as:  KENALOG Apply 1 application topically 2 (two) times daily.       Disposition and follow-up:   AngelaAngela Huerta was discharged from [facility] in Good condition.  At the hospital follow up visit please address:  1.  Asymptomatic iron deficiency anemia - Unknown etiology - Received 2u RBCs and feraheme - Hb increased from 6.4 to 9.2 - Please repeat CBC in 6 weeks - Please consider diagnostic colonoscopy to evaluate source of bleeding  2.  Labs / imaging needed at time of follow-up: CBC   3.  Pending labs/ test needing follow-up: none  Follow-up Appointments: to f/u with PCP Dr. [facility] Course by problem list: 1. Asymptomatic iron deficiency anemia: Angela Huerta is a 48 yo female with a medical history of iron deficiency anemia and hypothyroidism who presented as a direct admission for a Hb of 6.6 during outpatient lab work with her PCP on 1/23. She received 2u RBCs and Feraheme with an increase in her Hb from 6.4 to 9.2. No signs or symptoms of bleeding. Patient has a history of fibroids and frequent menses, but not excessive or recently changed. No signs or symptoms of celiac disease or H pylori to explain decreased iron.  Patient has never  had a colonoscopy. Please f/u hemoglobin at hospital follow-up. Please consider outpatient screening colonoscopy to rule out GI malignancy or other source of GI bleed.   Discharge Vitals:   BP (!) 143/100 (BP Location: Right Arm)   Pulse (!) 51   Temp 98.2 F (36.8 C) (Oral)   Resp 16   Wt 79.4 kg   LMP 03/06/2018 (Exact Date)   SpO2 99%   BMI 29.13 kg/m   Pertinent Labs, Studies, and Procedures:  CBC Latest Ref Rng & Units 03/12/2018 03/11/2018 03/08/2018  WBC 4.0 - 10.5 K/uL 6.0 5.0 6.7  Hemoglobin 12.0 - 15.0 g/dL 9.2(L) 6.3(LL) 6.6(LL)  Hematocrit 36.0 - 46.0 % 32.2(L) 25.0(L) 25.5(L)  Platelets 150 - 400 K/uL 281 286 289   CMP Latest Ref Rng & Units 03/11/2018 03/16/2017 09/11/2011  Glucose 70 - 99 mg/dL 98 94 111(H)  BUN 6 - 20 mg/dL 10 13 7   Creatinine 0.44 - 1.00 mg/dL 0.82 0.75 0.80  Sodium 135 - 145 mmol/L 138 139 137  Potassium 3.5 - 5.1 mmol/L 3.8 4.3 3.4(L)  Chloride 98 - 111 mmol/L 107 106 101  CO2 22 - 32 mmol/L 24 21 26   Calcium 8.9 - 10.3 mg/dL 9.2 8.9 9.2  Total Protein 6.5 - 8.1 g/dL 7.0 - -  Total Bilirubin 0.3 - 1.2 mg/dL 0.5 - -  Alkaline Phos 38 - 126 U/L 80 - -  AST 15 - 41 U/L 21 - -  ALT 0 - 44 U/L  17 - -     Discharge Instructions: Discharge Instructions    Discharge instructions   Complete by:  As directed    It was a pleasure taking care of you, Angela Huerta!  You were hospitalized for anemia. After receiving blood and iron, your hemoglobin increased from 6.4 to 9.2. You are safe for discharge home today.  You should follow-up with Dr. Lynnae Huerta in about 6 weeks to check your blood work again. You should also talk with Dr. Lynnae Huerta about whether you would benefit from a colonoscopy.  Feel free to call our clinic at [phone] if you have any questions.  Thanks, Dr. Annie Huerta  Please follow-up with Dr. Lynnae Huerta as      Signed: Loyd Huerta, Andree Elk, MD 03/12/2018, 12:14 PM   Pager: [phone]

## 2018-03-12 NOTE — Progress Notes (Signed)
   Subjective: Patient reports feeling well. No complaints or overnight events. She was informed we are awaiting for her to have an appropriate response in her Hgb after receiving 2u RBCs and feraheme. All questions and concerns addressed.  Objective:  Vital signs in last 24 hours: Vitals:   03/11/18 2309 03/11/18 2344 03/12/18 0335 03/12/18 0504  BP: (!) 166/79 (!) 153/87 (!) 158/80 (!) 143/100  Pulse: (!) 52 (!) 57 (!) 54 (!) 51  Resp: 18 18 18 16   Temp: 98.6 F (37 C) 98.8 F (37.1 C) 98.7 F (37.1 C) 98.2 F (36.8 C)  TempSrc: Oral Oral Oral Oral  SpO2: 100% 100% 99% 99%  Weight:       Gen: seen comfortably laying in bed, no distress CV: RRR, no murmurs Ext: no edema  Assessment/Plan:  Active Problems:   Iron deficiency anemia  Angela Huerta is a 48 yo female with a medical history of iron deficiency anemia and hypothyroidism who presented as a direct admission for a Hb of 6.6 during outpatient lab work with her PCP on 1/23. She was admitted for further evaluation and management.  Iron Deficiency Anemia - Received 2u RBCs and feraheme yesterday. Tolerated the transfusions well. Repeat Hb this am 9.2, appropriate increase. Plan  - Discharge home today   Hypothyroidism: continue home synthroid  Dispo: Anticipated discharge today.  Angela Huerta, Andree Elk, MD 03/12/2018, 6:59 AM Pager: [phone]

## 2018-03-13 ENCOUNTER — Telehealth: Payer: Self-pay

## 2018-03-13 LAB — HIV ANTIBODY (ROUTINE TESTING W REFLEX): HIV SCREEN 4TH GENERATION: NONREACTIVE

## 2018-03-13 NOTE — Telephone Encounter (Signed)
ERROR

## 2018-04-26 ENCOUNTER — Encounter: Payer: Self-pay | Admitting: Internal Medicine

## 2018-04-26 ENCOUNTER — Other Ambulatory Visit: Payer: Self-pay

## 2018-04-26 ENCOUNTER — Ambulatory Visit: Payer: BLUE CROSS/BLUE SHIELD | Admitting: Internal Medicine

## 2018-04-26 DIAGNOSIS — D509 Iron deficiency anemia, unspecified: Secondary | ICD-10-CM | POA: Diagnosis not present

## 2018-04-26 DIAGNOSIS — Z9889 Other specified postprocedural states: Secondary | ICD-10-CM

## 2018-04-26 DIAGNOSIS — Z0000 Encounter for general adult medical examination without abnormal findings: Secondary | ICD-10-CM

## 2018-04-26 DIAGNOSIS — D500 Iron deficiency anemia secondary to blood loss (chronic): Secondary | ICD-10-CM | POA: Diagnosis not present

## 2018-04-26 NOTE — Patient Instructions (Signed)
1. I will mail you your test results 2. Complete the stool test and mail in

## 2018-04-26 NOTE — Assessment & Plan Note (Addendum)
This problem is chronic and stable.  Her hemoglobin had decreased from 8.4 last year to 6.4 in January.  She was hospitalized overnight and received 2 units PRBC and a single Feraheme infusion.  Her hemoglobin at discharge was 9.2.  Her MCV is 60, RDW is 21, and her ferritin last year was 6.  Her anemia is microcytic due to the iron deficiency.  He denies increased menstrual blood loss, vegetarian diet, or a personal or family history of colon cancer.  Her mother did have fibroids and anemia and had a TIA.  We discussed the etiology of the iron deficiency anemia and she decided to pursue stool testing for colon cancer screening as she felt that a colonoscopy was overkill due to her age and lack of family history.  I am checking a CBC and a ferritin today.  If her ferritin has not normalized from the iron infusion 6 weeks ago, ideally she would get a second infusion but her insurance co-pay has always presented her from getting an outpatient iron infusion.  She feels no different after her transfusion and iron infusion with the exception that she feels that this caused increased urination.  PLAN : CBC and ferritin

## 2018-04-26 NOTE — Assessment & Plan Note (Signed)
I have requested her Pap smear records from her gynecologist.

## 2018-04-26 NOTE — Progress Notes (Signed)
   Subjective:    Patient ID: Angela Huerta, female    DOB: Dec 08, 1970, 48 y.o.   MRN: [id number]  HPI  Angela Huerta is here for HFU. Please see the A&P for the status of the pt's chronic medical problems.  ROS : per ROS section and in problem oriented charting. All other systems are negative.  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   Review of Systems  Respiratory: Negative for shortness of breath.   Gastrointestinal: Positive for constipation. Negative for blood in stool.       Objective:   Physical Exam Vitals signs and nursing note reviewed.  Constitutional:      Appearance: Normal appearance. She is normal weight.  HENT:     Head: Normocephalic and atraumatic.     Right Ear: External ear normal.     Left Ear: External ear normal.     Nose: Nose normal.  Eyes:     Extraocular Movements: Extraocular movements intact.     Conjunctiva/sclera: Conjunctivae normal.  Neurological:     General: No focal deficit present.     Mental Status: She is alert. Mental status is at baseline.  Psychiatric:        Mood and Affect: Mood normal.        Behavior: Behavior normal.        Thought Content: Thought content normal.        Judgment: Judgment normal.           Assessment & Plan:

## 2018-04-27 ENCOUNTER — Encounter: Payer: Self-pay | Admitting: Internal Medicine

## 2018-04-27 LAB — FERRITIN: Ferritin: 11 ng/mL — ABNORMAL LOW (ref 15–150)

## 2018-04-27 LAB — CBC
Hematocrit: 42.6 % (ref 34.0–46.6)
Hemoglobin: 12.7 g/dL (ref 11.1–15.9)
MCH: 22.9 pg — ABNORMAL LOW (ref 26.6–33.0)
MCHC: 29.8 g/dL — AB (ref 31.5–35.7)
MCV: 77 fL — ABNORMAL LOW (ref 79–97)
Platelets: 289 10*3/uL (ref 150–450)
RBC: 5.55 x10E6/uL — ABNORMAL HIGH (ref 3.77–5.28)
WBC: 6 10*3/uL (ref 3.4–10.8)

## 2018-05-09 ENCOUNTER — Other Ambulatory Visit: Payer: BLUE CROSS/BLUE SHIELD

## 2018-05-09 ENCOUNTER — Other Ambulatory Visit: Payer: Self-pay

## 2018-05-09 DIAGNOSIS — D500 Iron deficiency anemia secondary to blood loss (chronic): Secondary | ICD-10-CM

## 2018-05-10 LAB — FECAL OCCULT BLOOD, IMMUNOCHEMICAL: Fecal Occult Bld: NEGATIVE

## 2018-05-11 ENCOUNTER — Encounter: Payer: Self-pay | Admitting: Internal Medicine

## 2018-05-14 ENCOUNTER — Telehealth: Payer: Self-pay | Admitting: Internal Medicine

## 2018-05-14 NOTE — Telephone Encounter (Signed)
Pt rtning a call back about her test results.  Pt states she rec'd .

## 2018-05-15 NOTE — Telephone Encounter (Signed)
Noted-phone call complete.Despina Hidden Cassady3/31/20209:32 AM

## 2019-01-25 ENCOUNTER — Telehealth: Payer: Self-pay | Admitting: *Deleted

## 2019-01-25 NOTE — Telephone Encounter (Signed)
Received fax from [facility] stating, "Levothyroxine NTI drug. May we substitute with available manufacturer?"

## 2019-01-25 NOTE — Telephone Encounter (Signed)
Yes a different manufacturer is okay.  She has had to change manufacturers before.  Please schedule her an appointment January through March.  This may be telehealth if she prefers that she would have to go to get labs either at [facility] or in our clinic.  If she prefers in person visit, that is also fine with me.

## 2019-01-28 NOTE — Telephone Encounter (Signed)
Called patient.  N/A.  LMOM for the patient to called back to sch an appt.

## 2019-01-28 NOTE — Telephone Encounter (Signed)
[facility] called / informed "Yes a different manufacturer is okay" per Dr Lynnae January.

## 2019-03-07 ENCOUNTER — Encounter: Payer: Self-pay | Admitting: Internal Medicine

## 2019-03-07 ENCOUNTER — Other Ambulatory Visit: Payer: Self-pay

## 2019-03-07 ENCOUNTER — Ambulatory Visit (INDEPENDENT_AMBULATORY_CARE_PROVIDER_SITE_OTHER): Payer: BC Managed Care – PPO | Admitting: Internal Medicine

## 2019-03-07 ENCOUNTER — Encounter: Payer: BLUE CROSS/BLUE SHIELD | Admitting: Internal Medicine

## 2019-03-07 VITALS — BP 122/84 | HR 51 | Temp 98.4°F | Ht 65.0 in | Wt 184.2 lb

## 2019-03-07 DIAGNOSIS — Z87891 Personal history of nicotine dependence: Secondary | ICD-10-CM | POA: Diagnosis not present

## 2019-03-07 DIAGNOSIS — D500 Iron deficiency anemia secondary to blood loss (chronic): Secondary | ICD-10-CM | POA: Diagnosis not present

## 2019-03-07 DIAGNOSIS — E669 Obesity, unspecified: Secondary | ICD-10-CM | POA: Diagnosis not present

## 2019-03-07 DIAGNOSIS — Z79899 Other long term (current) drug therapy: Secondary | ICD-10-CM

## 2019-03-07 DIAGNOSIS — Z8639 Personal history of other endocrine, nutritional and metabolic disease: Secondary | ICD-10-CM

## 2019-03-07 DIAGNOSIS — E039 Hypothyroidism, unspecified: Secondary | ICD-10-CM | POA: Diagnosis not present

## 2019-03-07 DIAGNOSIS — Z6830 Body mass index (BMI) 30.0-30.9, adult: Secondary | ICD-10-CM

## 2019-03-07 DIAGNOSIS — D509 Iron deficiency anemia, unspecified: Secondary | ICD-10-CM | POA: Diagnosis not present

## 2019-03-07 LAB — POCT GLYCOSYLATED HEMOGLOBIN (HGB A1C): Hemoglobin A1C: 5.1 % (ref 4.0–5.6)

## 2019-03-07 LAB — GLUCOSE, CAPILLARY: Glucose-Capillary: 99 mg/dL (ref 70–99)

## 2019-03-07 NOTE — Patient Instructions (Addendum)
Thank you, Angela Huerta for allowing [place] to provide your care today. Today we discussed Annual visit..    I have ordered CBC, Lipid panel, Hgb A1C, TSH  labs for you. I will call if any are abnormal.    I have place a referrals to none.   I have ordered the following tests: none   I have ordered the following medication/changed the following medications: none   Please follow-up in 1 year with Dr. Lynnae January.    Should you have any questions or concerns please call the internal medicine clinic at [phone].    Marianna Payment, D.O. [facility]

## 2019-03-07 NOTE — Assessment & Plan Note (Signed)
Patient has a history of obesity.   Plan: - Repeat Hgb A1C

## 2019-03-07 NOTE — Assessment & Plan Note (Deleted)
Patient has a history of iron deficiency anemia. She is taking OTC iron supplementation and tolerating it well.   Plan: - Repeat CBC today.

## 2019-03-07 NOTE — Assessment & Plan Note (Signed)
Patient has a history of hypothyroidism and here for annual visit. She denies any symptoms of hypo/hyperthyroidism.  Plan: - Repeat TSH today.

## 2019-03-07 NOTE — Progress Notes (Signed)
CC: Annual Visit  HPI:  [name] is a 49 y.o. female with a past medical history stated below and presents today for annual visit. Please see problem based assessment and plan for additional details.   Past Medical History:  Diagnosis Date  . Alopecia   . Eczema   . Fibroids   . Hypothyroidism    Dx as part of intertility W/U  . Miscarriage   . Seasonal allergies     Family History  Problem Relation Age of Onset  . Fibroids Mother   . Pancreatic cancer Maternal Grandfather   . Heart disease Paternal Grandmother   . Breast cancer Paternal Aunt   . Diabetes Paternal Aunt   . Colon cancer Neg Hx      Social History   Socioeconomic History  . Marital status: Single    Spouse name: Not on file  . Number of children: Not on file  . Years of education: Not on file  . Highest education level: Not on file  Occupational History  . Not on file  Tobacco Use  . Smoking status: Former Research scientist (life sciences)  . Smokeless tobacco: Never Used  . Tobacco comment: quit in 2000  Substance and Sexual Activity  . Alcohol use: Yes    Comment: couple drinks per year  . Drug use: Not on file  . Sexual activity: Not on file  Other Topics Concern  . Not on file  Social History Narrative  . Not on file   Social Determinants of Health   Financial Resource Strain:   . Difficulty of Paying Living Expenses: Not on file  Food Insecurity:   . Worried About Charity fundraiser in the Last Year: Not on file  . Ran Out of Food in the Last Year: Not on file  Transportation Needs:   . Lack of Transportation (Medical): Not on file  . Lack of Transportation (Non-Medical): Not on file  Physical Activity:   . Days of Exercise per Week: Not on file  . Minutes of Exercise per Session: Not on file  Stress:   . Feeling of Stress : Not on file  Social Connections:   . Frequency of Communication with Friends and Family: Not on file  . Frequency of Social Gatherings with Friends and Family: Not  on file  . Attends Religious Services: Not on file  . Active Member of Clubs or Organizations: Not on file  . Attends Archivist Meetings: Not on file  . Marital Status: Not on file  Intimate Partner Violence:   . Fear of Current or Ex-Partner: Not on file  . Emotionally Abused: Not on file  . Physically Abused: Not on file  . Sexually Abused: Not on file     Review of Systems: Review of Systems  Constitutional: Negative for chills, fever, malaise/fatigue and weight loss.  Respiratory: Negative for cough and shortness of breath.   Cardiovascular: Negative for chest pain and leg swelling.  Gastrointestinal: Negative for abdominal pain.  Genitourinary: Negative for dysuria and frequency.  Neurological: Negative for weakness.  Psychiatric/Behavioral: Negative for depression.     There were no vitals filed for this visit.   Physical Exam: Physical Exam  Constitutional: She is oriented to person, place, and time and well-developed, well-nourished, and in no distress.  HENT:  Head: Normocephalic and atraumatic.  Eyes: EOM are normal.  Cardiovascular: Normal rate, regular rhythm, normal heart sounds and intact distal pulses. Exam reveals no gallop and no friction rub.  No murmur heard. Pulmonary/Chest: Effort normal and breath sounds normal. No respiratory distress. She exhibits no tenderness.  Abdominal: Soft. She exhibits no distension. There is no abdominal tenderness.  Musculoskeletal:        General: No tenderness or edema. Normal range of motion.     Cervical back: Normal range of motion.  Neurological: She is alert and oriented to person, place, and time.  Skin: Skin is warm and dry.     Assessment & Plan:   See Encounters Tab for problem based charting.  Patient discussed with Dr. Dareen Piano

## 2019-03-07 NOTE — Assessment & Plan Note (Addendum)
Patient has a history of iron deficiency anemia. She is taking OTC iron supplementation and tolerating it well.   Plan: - Repeat CBC today.  - Repeat ferritin

## 2019-03-08 LAB — CBC
Hematocrit: 34.9 % (ref 34.0–46.6)
Hemoglobin: 11.2 g/dL (ref 11.1–15.9)
MCH: 27.9 pg (ref 26.6–33.0)
MCHC: 32.1 g/dL (ref 31.5–35.7)
MCV: 87 fL (ref 79–97)
Platelets: 287 10*3/uL (ref 150–450)
RBC: 4.02 x10E6/uL (ref 3.77–5.28)
RDW: 13.1 % (ref 11.7–15.4)
WBC: 4.7 10*3/uL (ref 3.4–10.8)

## 2019-03-08 LAB — LIPID PANEL
Chol/HDL Ratio: 3.2 ratio (ref 0.0–4.4)
Cholesterol, Total: 198 mg/dL (ref 100–199)
HDL: 62 mg/dL (ref >39)
LDL Chol Calc (NIH): 125 mg/dL — ABNORMAL HIGH (ref 0–99)
Triglycerides: 62 mg/dL (ref 0–149)
VLDL Cholesterol Cal: 11 mg/dL (ref 5–40)

## 2019-03-08 LAB — TSH: TSH: 1.48 u[IU]/mL (ref 0.450–4.500)

## 2019-03-08 LAB — FERRITIN: Ferritin: 30 ng/mL (ref 15–150)

## 2019-03-11 ENCOUNTER — Encounter: Payer: BLUE CROSS/BLUE SHIELD | Admitting: Internal Medicine

## 2019-03-11 NOTE — Progress Notes (Signed)
Internal Medicine Clinic Attending  Case discussed with Dr. Coe at the time of the visit.  We reviewed the resident's history and exam and pertinent patient test results.  I agree with the assessment, diagnosis, and plan of care documented in the resident's note.    

## 2019-04-09 ENCOUNTER — Other Ambulatory Visit: Payer: Self-pay

## 2019-04-09 MED ORDER — TRIAMCINOLONE ACETONIDE 0.1 % EX CREA: 1.0000 "application " | TOPICAL_CREAM | Freq: Two times a day (BID) | CUTANEOUS | 3 refills | Status: DC

## 2019-04-09 MED ORDER — FERROUS SULFATE 325 (65 FE) MG PO TABS: 325.0000 mg | ORAL_TABLET | Freq: Every day | ORAL | 3 refills | Status: DC

## 2019-04-09 MED ORDER — LEVOTHYROXINE SODIUM 75 MCG PO TABS: 75.0000 ug | ORAL_TABLET | Freq: Every day | ORAL | 3 refills | Status: DC

## 2019-04-09 NOTE — Telephone Encounter (Signed)
levothyroxine (SYNTHROID, LEVOTHROID) 75 MCG tablet  triamcinolone cream (KENALOG) 0.1 %  ferrous sulfate 325 (65 FE) MG tablet(Expired), refill request @  [place], [place] - [address] [phone] (Phone) [phone] (Fax)

## 2019-04-10 ENCOUNTER — Other Ambulatory Visit: Payer: Self-pay | Admitting: Obstetrics and Gynecology

## 2019-04-10 DIAGNOSIS — R928 Other abnormal and inconclusive findings on diagnostic imaging of breast: Secondary | ICD-10-CM

## 2019-04-15 ENCOUNTER — Other Ambulatory Visit: Payer: Self-pay | Admitting: Obstetrics and Gynecology

## 2019-04-15 DIAGNOSIS — D250 Submucous leiomyoma of uterus: Secondary | ICD-10-CM

## 2019-05-07 ENCOUNTER — Encounter: Payer: Self-pay | Admitting: Internal Medicine

## 2019-05-08 ENCOUNTER — Telehealth: Payer: Self-pay

## 2019-05-08 NOTE — Telephone Encounter (Signed)
Patient called and is requesting a copy of her lab results from 03/07/19 for her GYN.  Pt states she is unable to come to office to sign release for records.  Copy of results mailed to patient.  Pt is also requesting a call from office manager, Dorris, about an issue they have discussed.  States Glass blower/designer knows what this is regarding, patient did not give details. SChaplin, RN,BSN

## 2019-05-15 ENCOUNTER — Telehealth: Payer: Self-pay

## 2019-05-15 NOTE — Telephone Encounter (Signed)
LMOM multiple times for pt to return call to schedule consult appt w/ no return call; [facility] for [facility] @ ofc informing her.

## 2019-06-18 ENCOUNTER — Other Ambulatory Visit: Payer: BC Managed Care – PPO

## 2019-06-25 ENCOUNTER — Other Ambulatory Visit: Payer: Self-pay

## 2019-06-25 ENCOUNTER — Other Ambulatory Visit: Payer: Self-pay | Admitting: Obstetrics and Gynecology

## 2019-06-25 ENCOUNTER — Ambulatory Visit
Admission: RE | Admit: 2019-06-25 | Discharge: 2019-06-25 | Disposition: A | Discharge status: Home / Self Care | Payer: BC Managed Care – PPO | Source: Ambulatory Visit | Attending: Obstetrics and Gynecology | Admitting: Obstetrics and Gynecology

## 2019-06-25 ENCOUNTER — Ambulatory Visit: Payer: BC Managed Care – PPO

## 2019-06-25 DIAGNOSIS — R928 Other abnormal and inconclusive findings on diagnostic imaging of breast: Secondary | ICD-10-CM

## 2019-08-05 ENCOUNTER — Encounter: Payer: Self-pay | Admitting: *Deleted

## 2019-12-27 ENCOUNTER — Other Ambulatory Visit: Payer: BC Managed Care – PPO

## 2020-01-29 ENCOUNTER — Other Ambulatory Visit: Payer: Self-pay

## 2020-01-29 ENCOUNTER — Ambulatory Visit
Admission: RE | Admit: 2020-01-29 | Discharge: 2020-01-29 | Disposition: A | Discharge status: Home / Self Care | Payer: BC Managed Care – PPO | Source: Ambulatory Visit | Attending: Obstetrics and Gynecology | Admitting: Obstetrics and Gynecology

## 2020-01-29 ENCOUNTER — Other Ambulatory Visit: Payer: Self-pay | Admitting: Obstetrics and Gynecology

## 2020-01-29 DIAGNOSIS — R928 Other abnormal and inconclusive findings on diagnostic imaging of breast: Secondary | ICD-10-CM

## 2020-03-27 ENCOUNTER — Encounter: Payer: BC Managed Care – PPO | Admitting: Internal Medicine

## 2020-04-14 ENCOUNTER — Other Ambulatory Visit: Payer: Self-pay | Admitting: *Deleted

## 2020-04-14 MED ORDER — LEVOTHYROXINE SODIUM 75 MCG PO TABS: 75.0000 ug | ORAL_TABLET | Freq: Every day | ORAL | 3 refills | Status: DC

## 2020-04-14 NOTE — Telephone Encounter (Signed)
Next appt scheduled 05/01/20 with PCP.

## 2020-05-01 ENCOUNTER — Encounter: Payer: Self-pay | Admitting: Internal Medicine

## 2020-05-01 ENCOUNTER — Ambulatory Visit (INDEPENDENT_AMBULATORY_CARE_PROVIDER_SITE_OTHER): Payer: BC Managed Care – PPO | Admitting: Internal Medicine

## 2020-05-01 ENCOUNTER — Other Ambulatory Visit: Payer: Self-pay

## 2020-05-01 VITALS — BP 127/83 | HR 52 | Temp 98.4°F | Ht 65.0 in | Wt 194.2 lb

## 2020-05-01 DIAGNOSIS — E039 Hypothyroidism, unspecified: Secondary | ICD-10-CM | POA: Diagnosis not present

## 2020-05-01 DIAGNOSIS — Z0000 Encounter for general adult medical examination without abnormal findings: Secondary | ICD-10-CM | POA: Diagnosis not present

## 2020-05-01 DIAGNOSIS — Z6832 Body mass index (BMI) 32.0-32.9, adult: Secondary | ICD-10-CM | POA: Diagnosis not present

## 2020-05-01 DIAGNOSIS — D219 Benign neoplasm of connective and other soft tissue, unspecified: Secondary | ICD-10-CM

## 2020-05-01 DIAGNOSIS — E669 Obesity, unspecified: Secondary | ICD-10-CM

## 2020-05-01 DIAGNOSIS — D500 Iron deficiency anemia secondary to blood loss (chronic): Secondary | ICD-10-CM

## 2020-05-01 MED ORDER — PLENITY WELCOME KIT PO CAPS: 3.0000 | ORAL_CAPSULE | Freq: Two times a day (BID) | ORAL | 6 refills | Status: DC

## 2020-05-01 NOTE — Progress Notes (Signed)
   Subjective:    Patient ID: Angela Huerta, female    DOB: 03-04-1970, 50 y.o.   MRN: [id number]  CC: 1 year follow up for anemia and hypothyroidism  HPI  [name] is a 50 year old woman with PMH of allergy/eczema, iron deficiency anemia, fibroids, hypothyroidism and obesity who presents for follow up.   [name] reports that she is doing well.  She does not have any acute complaints.  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  She notes that she would like to lose about 30# to make this happen.  She notes an affinity for sweets, but does not feel like she over eats.  Her BMI today is 32 which does put her in the obese category.  We discussed possibly diet and exercise and also medication options.  She has heard about a medication called Plenity and would like to try this.  We discussed the risks and benefits and she will see if she can afford the Rx.   Otherwise, she is taking her medications as prescribed.  We discussed screening exams including PAP smears and colonoscopy today.  She follows with [facility] Al[facility] at [facility].    Review of Systems  Constitutional: Negative for activity change, appetite change, fatigue and unexpected weight change.  Respiratory: Negative for cough and shortness of breath.   Cardiovascular: Negative for chest pain and leg swelling.  Gastrointestinal: Negative for abdominal pain, blood in stool, constipation and diarrhea.  Musculoskeletal: Negative for arthralgias and back pain.  Neurological: Negative for dizziness and weakness.       Objective:   Physical Exam Constitutional:      General: She is not in acute distress.    Appearance: Normal appearance. She is not toxic-appearing.  HENT:     Head: Normocephalic and atraumatic.  Cardiovascular:     Rate and Rhythm: Normal rate and regular rhythm.     Heart sounds: No murmur heard.   Pulmonary:     Effort: Pulmonary effort is  normal. No respiratory distress.     Breath sounds: Normal breath sounds.  Abdominal:     General: Abdomen is flat. There is no distension.  Musculoskeletal:        General: No swelling or tenderness.  Skin:    General: Skin is warm.     Coloration: Skin is not jaundiced or pale.  Neurological:     General: No focal deficit present.     Mental Status: She is alert and oriented to person, place, and time.  Psychiatric:        Mood and Affect: Mood normal.        Behavior: Behavior normal.    CMET, CBC, Ferritin and TSH today.        Assessment & Plan:  Return in 1 year.  She will send in weight measurements on MyChart for [place] to keep track.

## 2020-05-01 NOTE — Assessment & Plan Note (Signed)
Check Ferritin today.  Has been normal.  Continue once daily iron.

## 2020-05-01 NOTE — Assessment & Plan Note (Signed)
We discussed weight loss today including diet, exercise.  She would like to try Plenity and will pay out of pocket for this (not covered by insurance).  I will see her in a year, but she will send in weights through my chart and/or let me know of any side effects.   Plan Start Plenity 3 caps before lunch and dinner Recommendation to avoid taking with synthroid and iron given issues with absorption Discussed possible side effects

## 2020-05-01 NOTE — Patient Instructions (Addendum)
Angela Huerta -   It was so nice to meet you!  We will do lab work today to check on your blood counts and iron.   Please start Plenity 3 capsules twice a day before lunch and dinner for weight management.   Adverse effects are abdominal pain, diarrhea.   May affect the absorption of your other medications, take separately from synthroid and iron.   Take capsules with water 20 to 30 minutes before lunch and dinner.  Following administration, immediately consume an additional 16 ounces of water.  If a premeal dose is missed, may be administered during or immediately after the meal.

## 2020-05-01 NOTE — Assessment & Plan Note (Signed)
Scheduled for Colonoscopy today.  She has no family history of colon cancer, no personal history of colon cancer.  She has no change to stools or blood in the stool.   She follows with gynecology, Dr. Rogue Bussing, for PAP smear and other needs.

## 2020-05-01 NOTE — Assessment & Plan Note (Signed)
Follows with gynecology.  See above.

## 2020-05-01 NOTE — Assessment & Plan Note (Signed)
She is doing well today.  Denies fatigue, cold intolerance, changes to hair.  She is taking her synthroid in the morning apart from vitamins and food.  Her last TSH was at goal.   Plan TSH today Continue synthroid.

## 2020-05-02 LAB — CBC
Hematocrit: 37.4 % (ref 34.0–46.6)
Hemoglobin: 11.8 g/dL (ref 11.1–15.9)
MCH: 28 pg (ref 26.6–33.0)
MCHC: 31.6 g/dL (ref 31.5–35.7)
MCV: 89 fL (ref 79–97)
Platelets: 311 10*3/uL (ref 150–450)
RBC: 4.22 x10E6/uL (ref 3.77–5.28)
RDW: 13.6 % (ref 11.7–15.4)
WBC: 6.1 10*3/uL (ref 3.4–10.8)

## 2020-05-02 LAB — CMP14 + ANION GAP
ALT: 17 IU/L (ref 0–32)
AST: 21 IU/L (ref 0–40)
Albumin/Globulin Ratio: 1.3 (ref 1.2–2.2)
Albumin: 4.2 g/dL (ref 3.8–4.8)
Alkaline Phosphatase: 129 IU/L — ABNORMAL HIGH (ref 44–121)
Anion Gap: 16 mmol/L (ref 10.0–18.0)
BUN/Creatinine Ratio: 11 (ref 9–23)
BUN: 9 mg/dL (ref 6–24)
Bilirubin Total: <0.2 mg/dL (ref 0.0–1.2)
CO2: 21 mmol/L (ref 20–29)
Calcium: 9.5 mg/dL (ref 8.7–10.2)
Chloride: 102 mmol/L (ref 96–106)
Creatinine, Ser: 0.85 mg/dL (ref 0.57–1.00)
Globulin, Total: 3.2 g/dL (ref 1.5–4.5)
Glucose: 105 mg/dL — ABNORMAL HIGH (ref 65–99)
Potassium: 4.2 mmol/L (ref 3.5–5.2)
Sodium: 139 mmol/L (ref 134–144)
Total Protein: 7.4 g/dL (ref 6.0–8.5)
eGFR: 83 mL/min/{1.73_m2} (ref >59)

## 2020-05-02 LAB — FERRITIN: Ferritin: 39 ng/mL (ref 15–150)

## 2020-05-02 LAB — TSH: TSH: 1.36 u[IU]/mL (ref 0.450–4.500)

## 2020-05-05 ENCOUNTER — Encounter: Payer: Self-pay | Admitting: Internal Medicine

## 2020-05-05 ENCOUNTER — Telehealth: Payer: Self-pay

## 2020-05-05 DIAGNOSIS — R239 Unspecified skin changes: Secondary | ICD-10-CM

## 2020-05-05 DIAGNOSIS — E039 Hypothyroidism, unspecified: Secondary | ICD-10-CM

## 2020-05-05 NOTE — Telephone Encounter (Signed)
Received fax from [facility] regarding RX for Carboxymeth-Cellulose-CitricAc Which states:  "We cannot fill @ our store.  You can fax or escript RX to [facility].  Fax # [phone]"  RN asked Dr. Georgina Peer about [facility] and she looked this up and states it is legit, however, patient's insurance may not cover. Thank you, SChaplin, RN,BSN

## 2020-05-08 ENCOUNTER — Other Ambulatory Visit: Payer: Self-pay | Admitting: *Deleted

## 2020-05-08 MED ORDER — PLENITY WELCOME KIT PO CAPS: 3.0000 | ORAL_CAPSULE | Freq: Two times a day (BID) | ORAL | 6 refills | Status: DC

## 2020-05-08 NOTE — Telephone Encounter (Signed)
Patient called in asking why new med was sent to pharmacy in [place]. Explained this was done as requested by [facility]. Contact info of [facility] given to patient. She will contact them to ensure they will mail Rx to her home.  Also requesting refill on ferrous sulfate at [facility].

## 2020-05-08 NOTE — Telephone Encounter (Signed)
Sent to new pharmacy

## 2020-05-12 MED ORDER — FERROUS SULFATE 325 (65 FE) MG PO TABS: 325.0000 mg | ORAL_TABLET | Freq: Every day | ORAL | 3 refills | Status: DC

## 2020-07-29 ENCOUNTER — Ambulatory Visit
Admission: RE | Admit: 2020-07-29 | Discharge: 2020-07-29 | Disposition: A | Discharge status: Home / Self Care | Payer: BC Managed Care – PPO | Source: Ambulatory Visit | Attending: Obstetrics and Gynecology | Admitting: Obstetrics and Gynecology

## 2020-07-29 ENCOUNTER — Other Ambulatory Visit: Payer: Self-pay

## 2020-07-29 DIAGNOSIS — R928 Other abnormal and inconclusive findings on diagnostic imaging of breast: Secondary | ICD-10-CM

## 2020-07-31 ENCOUNTER — Other Ambulatory Visit: Payer: Self-pay | Admitting: Obstetrics and Gynecology

## 2020-07-31 DIAGNOSIS — R928 Other abnormal and inconclusive findings on diagnostic imaging of breast: Secondary | ICD-10-CM

## 2020-08-18 ENCOUNTER — Encounter: Payer: Self-pay | Admitting: *Deleted

## 2021-01-13 DIAGNOSIS — L2084 Intrinsic (allergic) eczema: Secondary | ICD-10-CM | POA: Insufficient documentation

## 2021-01-13 DIAGNOSIS — L739 Follicular disorder, unspecified: Secondary | ICD-10-CM | POA: Insufficient documentation

## 2021-04-26 ENCOUNTER — Other Ambulatory Visit: Payer: Self-pay | Admitting: Internal Medicine

## 2021-04-26 MED ORDER — LEVOTHYROXINE SODIUM 75 MCG PO TABS: 75.0000 ug | ORAL_TABLET | Freq: Every day | ORAL | 3 refills | Status: DC

## 2021-04-26 NOTE — Telephone Encounter (Signed)
Refill Request ? ?levothyroxine (SYNTHROID) 75 MCG tablet ? ?[place], [place] - [address] (Ph: [phone]) ?

## 2021-06-08 ENCOUNTER — Encounter: Payer: Self-pay | Admitting: Internal Medicine

## 2021-06-08 ENCOUNTER — Other Ambulatory Visit: Payer: Self-pay

## 2021-06-08 ENCOUNTER — Ambulatory Visit (INDEPENDENT_AMBULATORY_CARE_PROVIDER_SITE_OTHER): Payer: BC Managed Care – PPO | Admitting: Internal Medicine

## 2021-06-08 VITALS — BP 134/82 | HR 63 | Temp 98.0°F | Ht 65.0 in | Wt 183.6 lb

## 2021-06-08 DIAGNOSIS — E039 Hypothyroidism, unspecified: Secondary | ICD-10-CM | POA: Diagnosis not present

## 2021-06-08 DIAGNOSIS — D219 Benign neoplasm of connective and other soft tissue, unspecified: Secondary | ICD-10-CM | POA: Diagnosis not present

## 2021-06-08 DIAGNOSIS — N8500 Endometrial hyperplasia, unspecified: Secondary | ICD-10-CM | POA: Diagnosis not present

## 2021-06-08 DIAGNOSIS — D500 Iron deficiency anemia secondary to blood loss (chronic): Secondary | ICD-10-CM | POA: Diagnosis not present

## 2021-06-08 DIAGNOSIS — Z0000 Encounter for general adult medical examination without abnormal findings: Secondary | ICD-10-CM

## 2021-06-08 DIAGNOSIS — E669 Obesity, unspecified: Secondary | ICD-10-CM

## 2021-06-08 MED ORDER — METFORMIN HCL ER 500 MG PO TB24
500.0000 mg | ORAL_TABLET | Freq: Every day | ORAL | 11 refills | Status: DC
Start: 2021-06-08 — End: 2022-05-03

## 2021-06-08 NOTE — Assessment & Plan Note (Signed)
We discussed vaginal progesterone today.  I think this is a reasonable option, but dosing is not clear in the literature.  I would recommend she get a second opinion from gynecology, referral placed.  ? ?We discussed starting metformin for the anti-proliferative effect, and she is willing to try.  ? ?Plan ?Metformin XR '500mg'$  daily ?

## 2021-06-08 NOTE — Assessment & Plan Note (Signed)
At last check, H/H were within normal limits.  Will update blood work today.  ? ?Plan ?Check CBC, iron, TIBC, ferritin ?

## 2021-06-08 NOTE — Assessment & Plan Note (Signed)
She is due for a lipid panel given age, will order.  She is up to date on colonoscopy and mammogram.  DEXA scan at 12.  ?

## 2021-06-08 NOTE — Patient Instructions (Addendum)
Ms. Apollo - - ? ?It was a pleasure to see you today.  ? ?For your endometrial hyperplasia, I would recommend seeing Dr. Valentino Saxon or Dr. Benjie Karvonen at [facility].  I put in a referral for you today.  ? ?Please try metformin '500mg'$  daily to see if this helps with breakthrough bleeding and possibly weight loss.  ? ?I will mychart message you with your lab results.  ? ?Thank you! ?

## 2021-06-08 NOTE — Progress Notes (Signed)
? ?  Subjective:  ? ? Patient ID: Angela Huerta, female    DOB: Dec 05, 1970, 51 y.o.   MRN: [id number] ? ?1 year follow up for hypothyroidism ? ?HPI ? ?Angela Huerta is a 51 year old woman with PMH of hypothyroidism, iron deficiency anemia and endometrial hyperplasia with breakthrough bleeding.  She has been following with gynecology for this latter issue.  She has had D&C 2 or 3 times and has tried OCP and transexemic acid.  She has not had satisfactory results with any of these interventions and has had side effects to the oral medications.  She is frustrated that she is only being offered monitoring and/or hysterectomy.  She has found literature that supports the use of vaginal progesterone and would like a second opinion to see if this might be an option.  ? ?She further notes increased water intake and giving up cake and donuts for lent and she has had some successful weight loss.  ? ?Review of Systems  ?Constitutional:  Negative for activity change, appetite change, fatigue and unexpected weight change.  ?Respiratory:  Negative for cough and shortness of breath.   ?Cardiovascular:  Negative for chest pain and leg swelling.  ?Genitourinary:  Positive for menstrual problem and vaginal bleeding.  ?Musculoskeletal:  Negative for arthralgias and back pain.  ?Psychiatric/Behavioral:  Negative for decreased concentration and dysphoric mood.   ? ?   ?Objective:  ? Physical Exam ?Vitals and nursing note reviewed.  ?Constitutional:   ?   Appearance: Normal appearance.  ?Eyes:  ?   General: No scleral icterus. ?   Conjunctiva/sclera: Conjunctivae normal.  ?Cardiovascular:  ?   Rate and Rhythm: Normal rate and regular rhythm.  ?   Heart sounds: No murmur heard. ?Skin: ?   General: Skin is warm and dry.  ?Neurological:  ?   General: No focal deficit present.  ?   Mental Status: She is alert. Mental status is at baseline.  ?Psychiatric:     ?   Mood and Affect: Mood normal.     ?   Behavior: Behavior normal.  ? ? ?CMET,  lipid panel, CBC, iron, tibc, ferritin, TSH, FSH today ? ? ?   ?Assessment & Plan:  ?Return in 1 year, sooner if needed ? ?

## 2021-06-08 NOTE — Assessment & Plan Note (Signed)
She is taking her synthroid without issue.  Last TSH was at goal.  ? ?Plan ?Check TSH ?Continue synthroid ?

## 2021-06-09 LAB — CMP14 + ANION GAP
ALT: 15 IU/L (ref 0–32)
AST: 21 IU/L (ref 0–40)
Albumin/Globulin Ratio: 1.3 (ref 1.2–2.2)
Albumin: 3.9 g/dL (ref 3.8–4.9)
Alkaline Phosphatase: 108 IU/L (ref 44–121)
Anion Gap: 14 mmol/L (ref 10.0–18.0)
BUN/Creatinine Ratio: 17 (ref 9–23)
BUN: 13 mg/dL (ref 6–24)
Bilirubin Total: <0.2 mg/dL (ref 0.0–1.2)
CO2: 21 mmol/L (ref 20–29)
Calcium: 9.3 mg/dL (ref 8.7–10.2)
Chloride: 104 mmol/L (ref 96–106)
Creatinine, Ser: 0.76 mg/dL (ref 0.57–1.00)
Globulin, Total: 3.1 g/dL (ref 1.5–4.5)
Glucose: 89 mg/dL (ref 70–99)
Potassium: 4.5 mmol/L (ref 3.5–5.2)
Sodium: 139 mmol/L (ref 134–144)
Total Protein: 7 g/dL (ref 6.0–8.5)
eGFR: 95 mL/min/{1.73_m2} (ref >59)

## 2021-06-09 LAB — FOLLICLE STIMULATING HORMONE: FSH: 9.7 m[IU]/mL

## 2021-06-09 LAB — TSH: TSH: 1.27 u[IU]/mL (ref 0.450–4.500)

## 2021-06-09 LAB — CBC
Hematocrit: 32.9 % — ABNORMAL LOW (ref 34.0–46.6)
Hemoglobin: 9.7 g/dL — ABNORMAL LOW (ref 11.1–15.9)
MCH: 21.7 pg — ABNORMAL LOW (ref 26.6–33.0)
MCHC: 29.5 g/dL — ABNORMAL LOW (ref 31.5–35.7)
MCV: 74 fL — ABNORMAL LOW (ref 79–97)
Platelets: 355 10*3/uL (ref 150–450)
RBC: 4.46 x10E6/uL (ref 3.77–5.28)
RDW: 23.3 % — ABNORMAL HIGH (ref 11.7–15.4)
WBC: 5.3 10*3/uL (ref 3.4–10.8)

## 2021-06-09 LAB — LIPID PANEL
Chol/HDL Ratio: 3.5 ratio (ref 0.0–4.4)
Cholesterol, Total: 188 mg/dL (ref 100–199)
HDL: 53 mg/dL (ref >39)
LDL Chol Calc (NIH): 124 mg/dL — ABNORMAL HIGH (ref 0–99)
Triglycerides: 58 mg/dL (ref 0–149)
VLDL Cholesterol Cal: 11 mg/dL (ref 5–40)

## 2021-06-09 LAB — IRON AND TIBC
Iron Saturation: 11 % — ABNORMAL LOW (ref 15–55)
Iron: 38 ug/dL (ref 27–159)
Total Iron Binding Capacity: 334 ug/dL (ref 250–450)
UIBC: 296 ug/dL (ref 131–425)

## 2021-06-09 LAB — FERRITIN: Ferritin: 71 ng/mL (ref 15–150)

## 2021-06-11 ENCOUNTER — Encounter: Payer: BC Managed Care – PPO | Admitting: Internal Medicine

## 2021-09-29 ENCOUNTER — Other Ambulatory Visit: Payer: Self-pay | Admitting: Internal Medicine

## 2021-09-29 ENCOUNTER — Other Ambulatory Visit: Payer: Self-pay | Admitting: Obstetrics and Gynecology

## 2021-09-29 DIAGNOSIS — N6002 Solitary cyst of left breast: Secondary | ICD-10-CM

## 2021-09-29 MED ORDER — FERROUS SULFATE 325 (65 FE) MG PO TABS: 325.0000 mg | ORAL_TABLET | Freq: Every day | ORAL | 3 refills | Status: DC

## 2021-10-07 ENCOUNTER — Ambulatory Visit
Admission: RE | Admit: 2021-10-07 | Discharge: 2021-10-07 | Disposition: A | Discharge status: Home / Self Care | Payer: BC Managed Care – PPO | Source: Ambulatory Visit | Attending: Obstetrics and Gynecology | Admitting: Obstetrics and Gynecology

## 2021-10-07 DIAGNOSIS — N6002 Solitary cyst of left breast: Secondary | ICD-10-CM

## 2022-03-28 IMAGING — MG DIGITAL DIAGNOSTIC BILAT W/ TOMO W/ CAD
8 of 14 series · 8 of 40 positions shown · non-contrast
Comparison: PREVIOUS EXAMS.

CLINICAL DATA: Screening recall for possible right breast asymmetry
and possible left breast masses.

EXAM:
DIGITAL DIAGNOSTIC BILATERAL MAMMOGRAM WITH CAD AND TOMO
LEFT BREAST ULTRASOUND

[L CC synth-2D (1 of 3)]
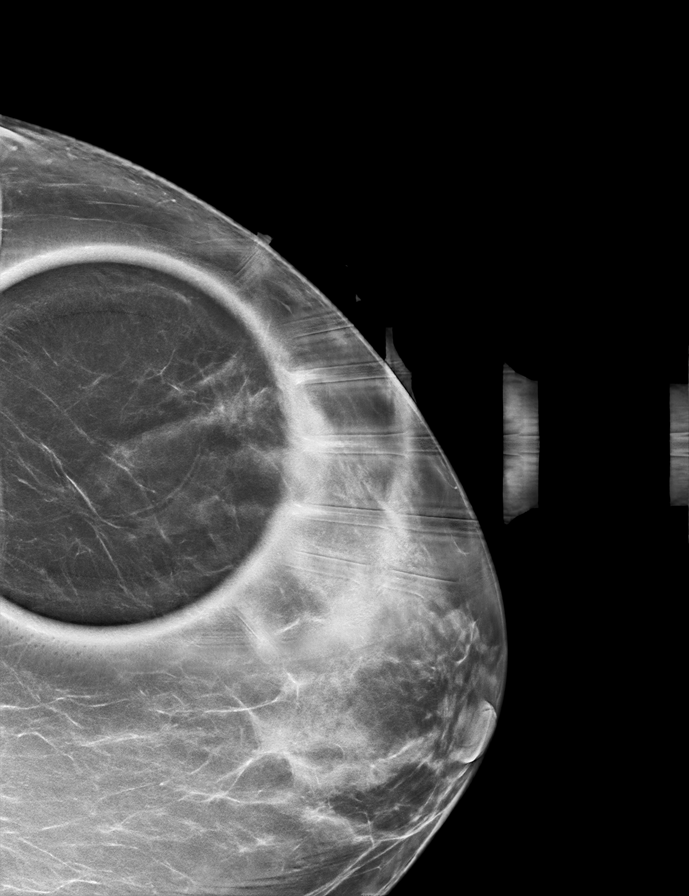

[R ML synth-2D]
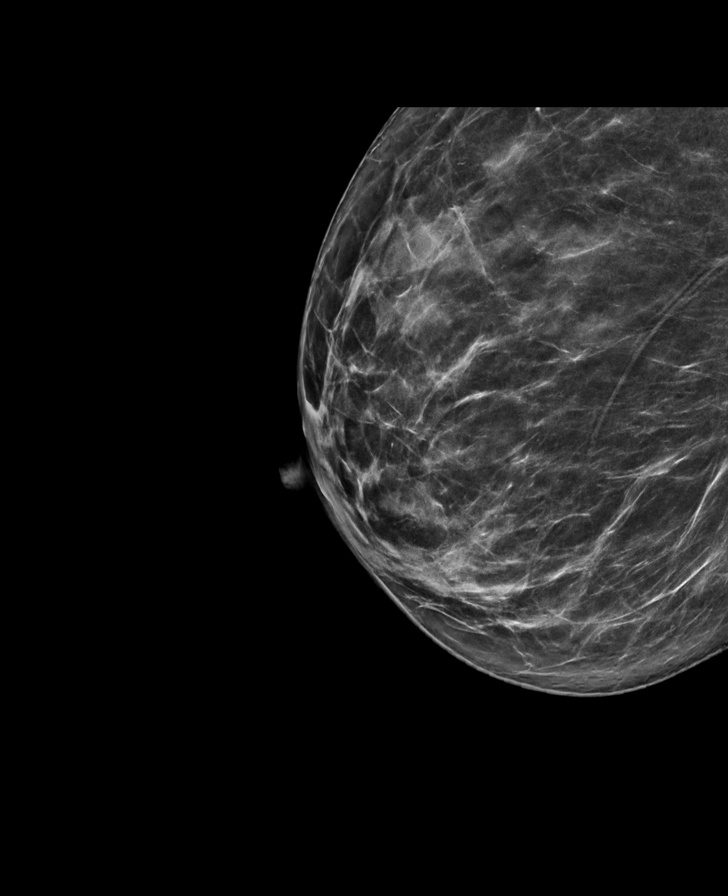

[L CC synth-2D (2 of 3)]
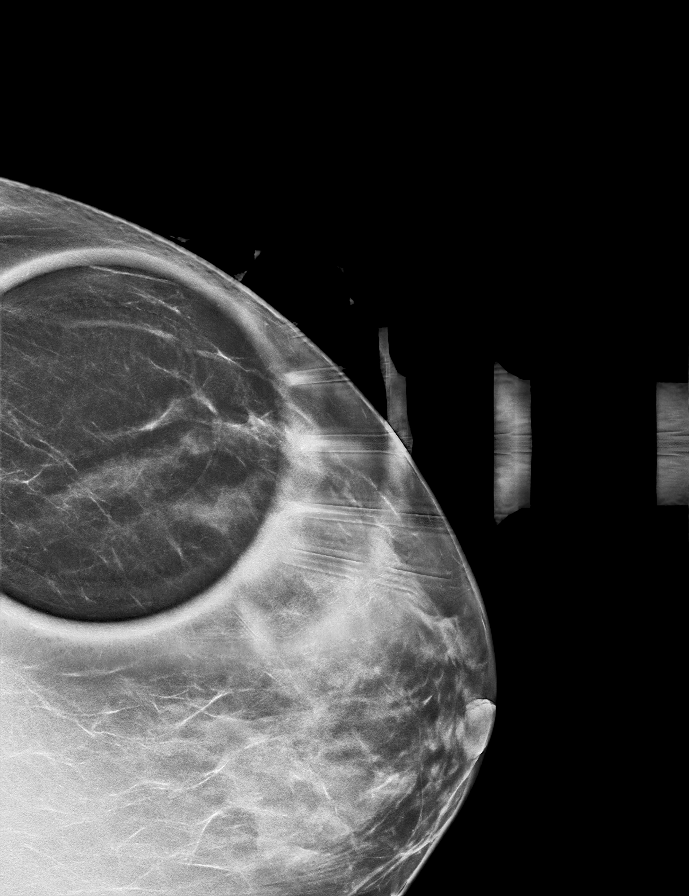

[R CC synth-2D]
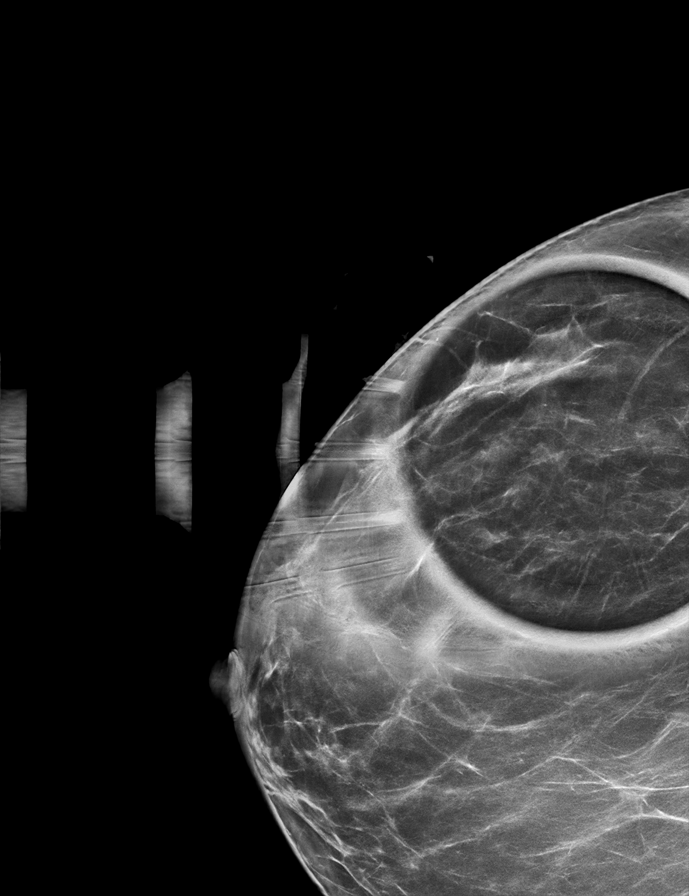

[L MLO synth-2D (1 of 2)]
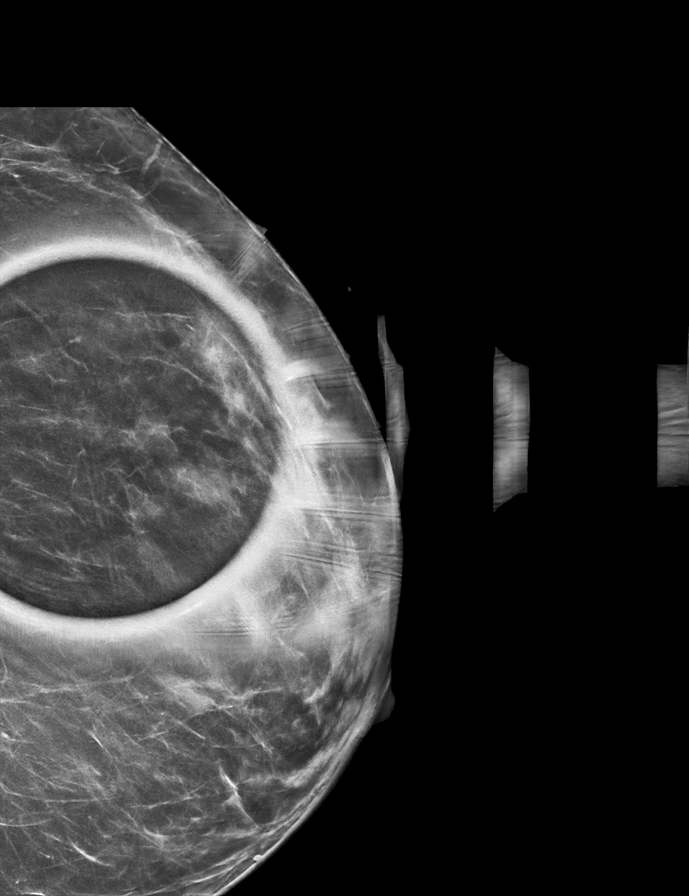

[L MLO synth-2D (2 of 2)]
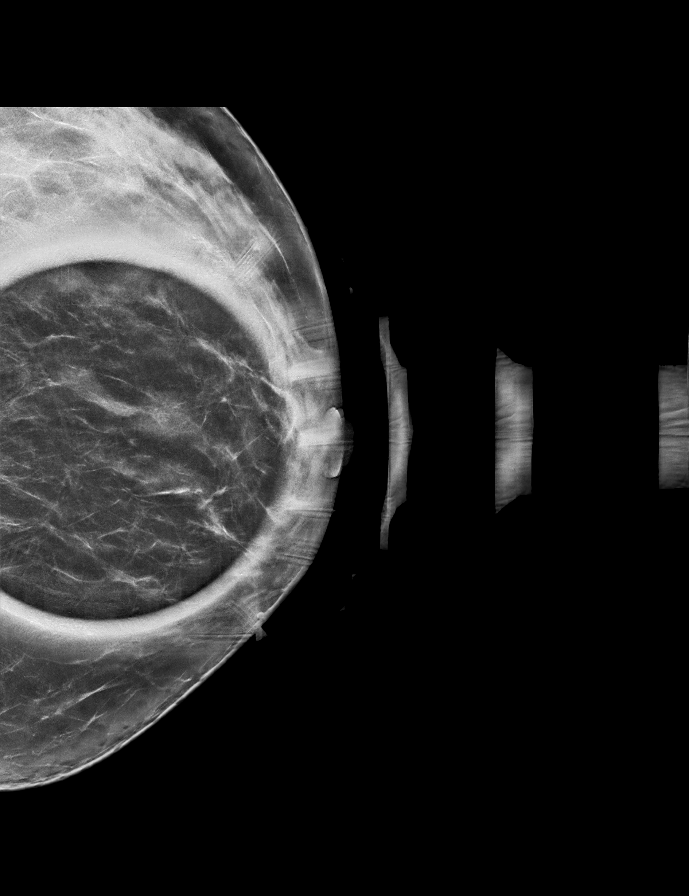

[L CC synth-2D (3 of 3)]
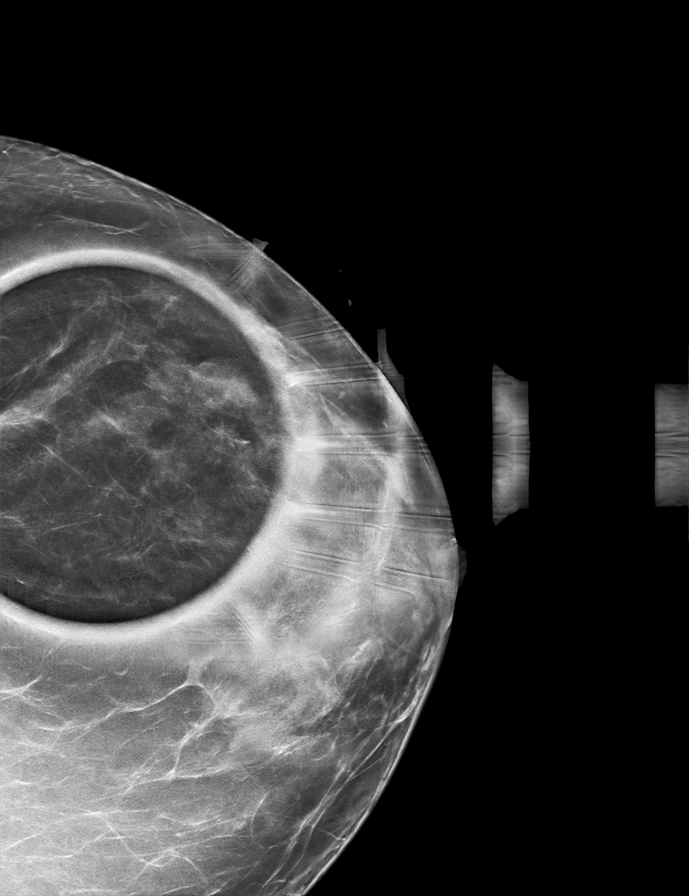

[L CC tomo · tomo slice 41/80.0]
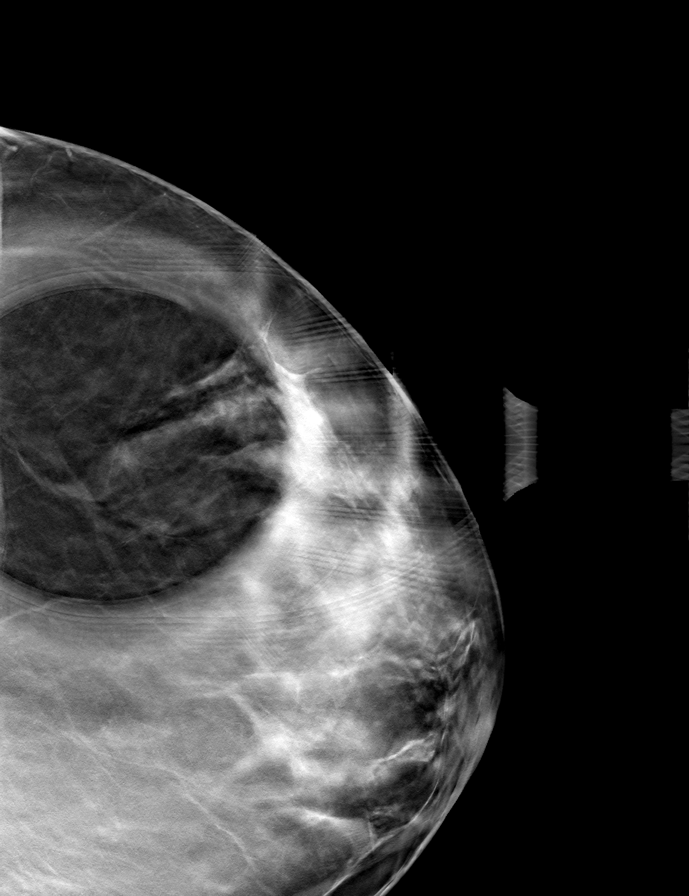

[8 of 40 positions shown; findings below may reference images not displayed]

ACR Breast Density Category c: The breast tissue is heterogeneously
dense, which may obscure small masses.
FINDINGS: Additional tomograms were performed of the right breast. The
initially questioned possible right breast asymmetry resolves on the
additional imaging with findings compatible with an area of
overlapping fibroglandular tissue. Spot compression tomograms were
performed of the left breast. There is an oval circumscribed mass in
the lower outer left breast measuring approximately 1.3 cm. There
are 2 oval circumscribed masses in the slightly upper outer left
breast each measuring approximately 0.7 cm.

Mammographic images were processed with CAD.

Targeted ultrasound of the left breast was performed. There is a
cyst at 3 o'clock 4 cm from the nipple measuring 1.4 x 0.8 x 1.2 cm.
This corresponds well with the mass seen in the lower outer left
breast at mammography. There is a cyst at 2 o'clock 5 cm from nipple
measuring 0.7 x 0.5 x 0.7 cm. An additional oval circumscribed
hypoechoic mass at 2 o'clock 6 cm from nipple measures 0.7 x 0.3 x
0.8 cm. This demonstrates imaging features suggestive of a mildly
complicated cyst. These 2 small masses correspond well with the
masses seen in the left breast at mammography.
IMPRESSION: Probably benign mass in the left breast at 2 o'clock 6 cm from the
nipple demonstrating imaging features suggestive of a mildly
complicated cyst. Additional benign cysts are seen in outer left
breast.

RECOMMENDATION:
Left breast ultrasound in 6 months.

I have discussed the findings and recommendations with the patient.
If applicable, a reminder letter will be sent to the patient
regarding the next appointment.

BI-RADS CATEGORY  3: Probably benign.

## 2022-04-20 ENCOUNTER — Encounter (INDEPENDENT_AMBULATORY_CARE_PROVIDER_SITE_OTHER): Payer: Self-pay

## 2022-05-03 ENCOUNTER — Other Ambulatory Visit: Payer: Self-pay

## 2022-05-03 ENCOUNTER — Ambulatory Visit (INDEPENDENT_AMBULATORY_CARE_PROVIDER_SITE_OTHER): Payer: BC Managed Care – PPO | Admitting: Internal Medicine

## 2022-05-03 VITALS — BP 120/69 | HR 54 | Temp 98.0°F | Ht 65.0 in | Wt 165.5 lb

## 2022-05-03 DIAGNOSIS — E039 Hypothyroidism, unspecified: Secondary | ICD-10-CM

## 2022-05-03 DIAGNOSIS — Z0000 Encounter for general adult medical examination without abnormal findings: Secondary | ICD-10-CM

## 2022-05-03 DIAGNOSIS — Z79899 Other long term (current) drug therapy: Secondary | ICD-10-CM

## 2022-05-03 DIAGNOSIS — D500 Iron deficiency anemia secondary to blood loss (chronic): Secondary | ICD-10-CM

## 2022-05-03 DIAGNOSIS — N8500 Endometrial hyperplasia, unspecified: Secondary | ICD-10-CM

## 2022-05-03 DIAGNOSIS — E669 Obesity, unspecified: Secondary | ICD-10-CM

## 2022-05-03 DIAGNOSIS — Z1231 Encounter for screening mammogram for malignant neoplasm of breast: Secondary | ICD-10-CM

## 2022-05-03 MED ORDER — FERROUS SULFATE 325 (65 FE) MG PO TABS
325.0000 mg | ORAL_TABLET | Freq: Every day | ORAL | 3 refills | Status: DC
Start: 2022-05-03 — End: 2022-10-31

## 2022-05-03 MED ORDER — LEVOTHYROXINE SODIUM 75 MCG PO TABS: 75.0000 ug | ORAL_TABLET | Freq: Every day | ORAL | 3 refills | Status: DC

## 2022-05-03 MED ORDER — METFORMIN HCL ER 500 MG PO TB24: 500.0000 mg | ORAL_TABLET | Freq: Every day | ORAL | 11 refills | Status: DC

## 2022-05-03 NOTE — Progress Notes (Unsigned)
Established Patient Office Visit  Subjective   Patient ID: Angela Huerta, female    DOB: [birth year]  Age: 52 y.o. MRN: [id number]  Chief Complaint  Patient presents with   Check-up Visit   Medication Refill    [name] is a 52 year old woman with PMH of hypothyroidism, allergies, DUB with iron deficiency anemia who presents for a preventative medicine visit.  She also needs refills of her medications.    We reviewed her medications including metformin which was started at last visit.  She notes improved intra-cycle bleeding.  She has constipation related to transit and iron.  We discussed the medications she uses for these.   We went over preventative measures including vaccination, pap smear (done at gynecology) and I ordered her MMG for this summer.      Review of Systems  Constitutional:  Negative for chills, fever, malaise/fatigue and weight loss.  Respiratory:  Negative for cough, shortness of breath and wheezing.   Cardiovascular:  Negative for chest pain, orthopnea and claudication.  Gastrointestinal:  Positive for constipation. Negative for abdominal pain, diarrhea, heartburn and nausea.  Genitourinary:  Negative for dysuria.  Musculoskeletal:  Negative for back pain and neck pain.  Neurological:  Negative for dizziness, focal weakness and weakness.  Psychiatric/Behavioral:  The patient is not nervous/anxious.       Objective:     BP 120/69 (BP Location: Left Arm, Patient Position: Sitting, Cuff Size: Normal)   Pulse (!) 54   Temp 98 F (36.7 C) (Oral)   Ht 5\' 5"  (1.651 m)   Wt 165 lb 8 oz (75.1 kg)   SpO2 100% Comment: Ra  BMI 27.54 kg/m  BP Readings from Last 3 Encounters:  05/03/22 120/69  06/08/21 134/82  05/01/20 127/83   Wt Readings from Last 3 Encounters:  05/03/22 165 lb 8 oz (75.1 kg)  06/08/21 183 lb 9.6 oz (83.3 kg)  05/01/20 194 lb 3.2 oz (88.1 kg)      Physical Exam Vitals and nursing note reviewed.  Constitutional:       General: She is not in acute distress.    Appearance: Normal appearance. She is not toxic-appearing.  HENT:     Head: Normocephalic and atraumatic.  Cardiovascular:     Rate and Rhythm: Normal rate and regular rhythm.     Heart sounds: Murmur (chronic, systolic) heard.  Pulmonary:     Effort: Pulmonary effort is normal. No respiratory distress.     Breath sounds: Normal breath sounds. No wheezing.  Abdominal:     General: Abdomen is flat.     Palpations: Abdomen is soft.  Musculoskeletal:     Right lower leg: No edema.     Left lower leg: No edema.  Skin:    General: Skin is warm and dry.  Neurological:     Mental Status: She is alert and oriented to person, place, and time. Mental status is at baseline.  Psychiatric:        Mood and Affect: Mood normal.        Behavior: Behavior normal.       The 10-year ASCVD risk score (Arnett DK, et al., 2019) is: 1.9%    Assessment & Plan:   Problem List Items Addressed This Visit       Unprioritized   Obesity (BMI 30-39.9) (Chronic)   Relevant Medications   metFORMIN (GLUCOPHAGE-XR) 500 MG 24 hr tablet   Hypothyroidism    Refill synthroid  Relevant Medications   levothyroxine (SYNTHROID) 75 MCG tablet   Other Relevant Orders   TSH   Health care maintenance    Reminded her to schedule for PAP smear. Discussed vaccinations.  Reviewed colonoscopy results.  Scheduled mammogram for this summer      Iron deficiency anemia    Refilled iron, she notes not taking regularly.       Relevant Medications   ferrous sulfate 325 (65 FE) MG tablet   Other Relevant Orders   CBC no Diff   Endometrial hyperplasia without atypia    Refill metformin       Relevant Medications   metFORMIN (GLUCOPHAGE-XR) 500 MG 24 hr tablet   Other Visit Diagnoses     Encounter for screening mammogram for breast cancer    -  Primary   Relevant Orders   MM 3D SCREENING MAMMOGRAM BILATERAL BREAST   Encounter for long-term current use of  medication       Relevant Orders   BMP8+Anion Gap       Return in about 1 year (around 05/03/2023).    Angela Chiquito, MD

## 2022-05-03 NOTE — Patient Instructions (Addendum)
Ms. Montecillo - -  Please come for a lab visit to get your blood work done.   Please schedule your mammogram this summer.   I have sent your Iron into [facility].  Please let me know if this is still expensive and I can send somewhere else.  I would recommend the [facility].   Gilles Chiquito, MD

## 2022-05-04 NOTE — Assessment & Plan Note (Signed)
Refilled iron, she notes not taking regularly.

## 2022-05-04 NOTE — Assessment & Plan Note (Signed)
Refill synthroid

## 2022-05-04 NOTE — Assessment & Plan Note (Signed)
Refill metformin

## 2022-05-04 NOTE — Assessment & Plan Note (Addendum)
Reminded her to schedule for PAP smear. Discussed vaccinations.  Reviewed colonoscopy results.  Scheduled mammogram for this summer

## 2022-10-28 ENCOUNTER — Other Ambulatory Visit (INDEPENDENT_AMBULATORY_CARE_PROVIDER_SITE_OTHER): Payer: BC Managed Care – PPO

## 2022-10-28 DIAGNOSIS — Z79899 Other long term (current) drug therapy: Secondary | ICD-10-CM | POA: Diagnosis not present

## 2022-10-28 DIAGNOSIS — D500 Iron deficiency anemia secondary to blood loss (chronic): Secondary | ICD-10-CM

## 2022-10-28 DIAGNOSIS — E039 Hypothyroidism, unspecified: Secondary | ICD-10-CM

## 2022-10-29 ENCOUNTER — Telehealth: Payer: Self-pay | Admitting: Student

## 2022-10-29 ENCOUNTER — Telehealth: Payer: Self-pay | Admitting: Internal Medicine

## 2022-10-29 NOTE — Telephone Encounter (Signed)
I discussed with Dr. Garey Ham this patient's critical lab result of Hgb 4.2. He called patient & urged her to come to the ER this morning, but she has not shown up yet.  I called patient x3, but unfortunately she did not answer. I left a voicemail instructing her to come to the [facility] ER or closest ER as soon as possible.

## 2022-10-29 NOTE — Telephone Encounter (Signed)
Received a page from laboratory about a critically low hemoglobin of 4.7 for this person.  Last hemoglobin in April was around 9.  Was able to get this person on the phone.  These labs were drawn at a lab only visit yesterday, 10/28/2022, for monitoring of chronic anemia.  Currently she feels well, screens negative for symptoms like chest pain, shortness of breath, jaundice.  She is not bleeding.  I recommended that she proceed to the emergency department for blood transfusion and further workup of her acute on chronic anemia.  Unfortunately she has several engagements this weekend and is unable to present to the hospital currently.  We discussed the risk of foregoing prompt medical care, including endorgan damage from acute anemia.  I reiterated my recommendation for hospital care, especially if she has chest pain or shortness of breath.  She will make every effort to present to the hospital in the next 24 to 48 hours.

## 2022-10-30 ENCOUNTER — Emergency Department (HOSPITAL_COMMUNITY)
Admission: EM | Admit: 2022-10-30 | Discharge: 2022-10-31 | Disposition: A | Discharge status: Home / Self Care | Payer: BC Managed Care – PPO | Attending: Emergency Medicine | Admitting: Emergency Medicine

## 2022-10-30 ENCOUNTER — Encounter (HOSPITAL_COMMUNITY): Payer: Self-pay

## 2022-10-30 ENCOUNTER — Other Ambulatory Visit: Payer: Self-pay

## 2022-10-30 DIAGNOSIS — E039 Hypothyroidism, unspecified: Secondary | ICD-10-CM | POA: Diagnosis not present

## 2022-10-30 DIAGNOSIS — D509 Iron deficiency anemia, unspecified: Secondary | ICD-10-CM | POA: Insufficient documentation

## 2022-10-30 DIAGNOSIS — R799 Abnormal finding of blood chemistry, unspecified: Secondary | ICD-10-CM | POA: Diagnosis present

## 2022-10-30 LAB — CBC WITH DIFFERENTIAL/PLATELET
Abs Immature Granulocytes: 0 10*3/uL (ref 0.00–0.07)
Basophils Absolute: 0.2 10*3/uL — ABNORMAL HIGH (ref 0.0–0.1)
Basophils Relative: 2 %
Eosinophils Absolute: 0 10*3/uL (ref 0.0–0.5)
Eosinophils Relative: 0 %
HCT: 18.2 % — ABNORMAL LOW (ref 36.0–46.0)
Hemoglobin: 4.3 g/dL — CL (ref 12.0–15.0)
Lymphocytes Relative: 9 %
Lymphs Abs: 0.8 10*3/uL (ref 0.7–4.0)
MCH: 13 pg — ABNORMAL LOW (ref 26.0–34.0)
MCHC: 23.6 g/dL — ABNORMAL LOW (ref 30.0–36.0)
MCV: 54.8 fL — ABNORMAL LOW (ref 80.0–100.0)
Monocytes Absolute: 0.2 10*3/uL (ref 0.1–1.0)
Monocytes Relative: 2 %
Neutro Abs: 7.6 10*3/uL (ref 1.7–7.7)
Neutrophils Relative %: 87 %
Platelets: 479 10*3/uL — ABNORMAL HIGH (ref 150–400)
RBC: 3.32 MIL/uL — ABNORMAL LOW (ref 3.87–5.11)
RDW: 23.7 % — ABNORMAL HIGH (ref 11.5–15.5)
WBC: 8.7 10*3/uL (ref 4.0–10.5)
nRBC: 0.6 % — ABNORMAL HIGH (ref 0.0–0.2)
nRBC: 2 /100{WBCs} — ABNORMAL HIGH

## 2022-10-30 LAB — BASIC METABOLIC PANEL
Anion gap: 11 (ref 5–15)
BUN: 21 mg/dL — ABNORMAL HIGH (ref 6–20)
CO2: 20 mmol/L — ABNORMAL LOW (ref 22–32)
Calcium: 8.9 mg/dL (ref 8.9–10.3)
Chloride: 101 mmol/L (ref 98–111)
Creatinine, Ser: 1.14 mg/dL — ABNORMAL HIGH (ref 0.44–1.00)
GFR, Estimated: 58 mL/min — ABNORMAL LOW (ref >60)
Glucose, Bld: 112 mg/dL — ABNORMAL HIGH (ref 70–99)
Potassium: 3.8 mmol/L (ref 3.5–5.1)
Sodium: 132 mmol/L — ABNORMAL LOW (ref 135–145)

## 2022-10-30 LAB — BMP8+ANION GAP
Anion Gap: 13 mmol/L (ref 10.0–18.0)
BUN/Creatinine Ratio: 16 (ref 9–23)
BUN: 14 mg/dL (ref 6–24)
CO2: 21 mmol/L (ref 20–29)
Calcium: 9.6 mg/dL (ref 8.7–10.2)
Chloride: 103 mmol/L (ref 96–106)
Creatinine, Ser: 0.88 mg/dL (ref 0.57–1.00)
Glucose: 101 mg/dL — ABNORMAL HIGH (ref 70–99)
Potassium: 4.8 mmol/L (ref 3.5–5.2)
Sodium: 137 mmol/L (ref 134–144)
eGFR: 79 mL/min/{1.73_m2} (ref >59)

## 2022-10-30 LAB — CBC
Hematocrit: 19.9 % — ABNORMAL LOW (ref 34.0–46.6)
Hemoglobin: 4.7 g/dL — CL (ref 11.1–15.9)
MCH: 13.5 pg — ABNORMAL LOW (ref 26.6–33.0)
MCHC: 23.6 g/dL — CL (ref 31.5–35.7)
MCV: 57 fL — ABNORMAL LOW (ref 79–97)
NRBC: 1 % — ABNORMAL HIGH (ref 0–0)
Platelets: 387 10*3/uL (ref 150–450)
RBC: 3.49 x10E6/uL — ABNORMAL LOW (ref 3.77–5.28)
RDW: 21.7 % — ABNORMAL HIGH (ref 11.7–15.4)
WBC: 5.1 10*3/uL (ref 3.4–10.8)

## 2022-10-30 LAB — TSH: TSH: 0.151 u[IU]/mL — ABNORMAL LOW (ref 0.450–4.500)

## 2022-10-30 LAB — PREPARE RBC (CROSSMATCH)

## 2022-10-30 MED ORDER — SODIUM CHLORIDE 0.9% IV SOLUTION: Freq: Once | INTRAVENOUS | Status: AC

## 2022-10-30 NOTE — ED Triage Notes (Signed)
Pt was called by her pcp and told that her hemoglobin was 4. Something.  Denies any shortness of breath or weakness or feeling weird in any way.

## 2022-10-31 LAB — PATHOLOGIST SMEAR REVIEW

## 2022-10-31 NOTE — ED Provider Notes (Signed)
[place] EM[facility] DEPARTMENT AT [facility] Provider Note  CSN: [id number] Arrival date & time: 10/30/22 2207  Chief Complaint(s) abnormal labs  HPI Angela Huerta is a 52 y.o. female with a past medical history listed below including chronic iron deficiency anemia with reportedly baseline hemoglobin of 6 who presents to the emergency department for anemia and hemoglobin of 4.  Patient reports that she saw her PCP in March or April who had ordered labs to be done shortly after.  Due to patient's active lifestyle, she was unable to get the labs until yesterday.  She was called with the results.  She denies any fatigue, chest pain, shortness of breath.  Reports that she is going through menopause and has not had a menstrual cycle since early August.  Denies any hematochezia or melenic stools.  The history is provided by the patient.    Past Medical History Past Medical History:  Diagnosis Date   Alopecia    Eczema    Fibroids    Hypothyroidism    Dx as part of intertility W/U   Miscarriage    Seasonal allergies    Patient Active Problem List   Diagnosis Date Noted   Endometrial hyperplasia without atypia 06/08/2021   Intrinsic atopic dermatitis 01/13/2021   Folliculitis 01/13/2021   Iron deficiency anemia 03/23/2017   Obesity (BMI 30-39.9) 03/16/2017   Health care maintenance 03/16/2017   Hypothyroidism 03/11/2016   Allergic rhinitis 03/11/2016   Fibroids 03/11/2016   History of miscarriage 03/11/2016   Eczema 03/11/2016   Home Medication(s) Prior to Admission medications   Medication Sig Start Date End Date Taking? Authorizing Provider  levothyroxine (SYNTHROID) 75 MCG tablet Take 1 tablet (75 mcg total) by mouth daily before breakfast. 05/03/22  Yes Inez Catalina, MD  metFORMIN (GLUCOPHAGE-XR) 500 MG 24 hr tablet Take 1 tablet (500 mg total) by mouth daily with breakfast. 05/03/22  Yes Inez Catalina, MD  Multiple Vitamin (MULTIVITAMIN WITH MINERALS)  TABS tablet Take 1 tablet by mouth daily.   Yes [provider]  triamcinolone cream (KENALOG) 0.1 % Apply 1 application topically 2 (two) times daily. Patient taking differently: Apply 1 application  topically daily as needed (for eczema). 04/09/19  Yes Burns Spain, MD                                                                                                                                    Allergies Patient has no known allergies.  Review of Systems Review of Systems As noted in HPI  Physical Exam Vital Signs  I have reviewed the triage vital signs BP (!) 143/71 (BP Location: Right Arm)   Pulse (!) 56   Temp 98.4 F (36.9 C) (Oral)   Resp 18   Ht 5\' 5"  (1.651 m)   Wt 73.5 kg   LMP 09/21/2022   SpO2 100%   BMI 26.96 kg/m  Physical Exam Vitals reviewed.  Constitutional:      General: She is not in acute distress.    Appearance: She is well-developed. She is not diaphoretic.  HENT:     Head: Normocephalic and atraumatic.     Nose: Nose normal.  Eyes:     General: No scleral icterus.       Right eye: No discharge.        Left eye: No discharge.     Conjunctiva/sclera: Conjunctivae normal.     Pupils: Pupils are equal, round, and reactive to light.  Cardiovascular:     Rate and Rhythm: Normal rate and regular rhythm.     Heart sounds: Murmur (flow) heard.     No friction rub. No gallop.  Pulmonary:     Effort: Pulmonary effort is normal. No respiratory distress.     Breath sounds: Normal breath sounds. No stridor. No rales.  Abdominal:     General: There is no distension.     Palpations: Abdomen is soft.     Tenderness: There is no abdominal tenderness.  Musculoskeletal:        General: No tenderness.     Cervical back: Normal range of motion and neck supple.  Skin:    General: Skin is warm and dry.     Findings: No erythema or rash.  Neurological:     Mental Status: She is alert and oriented to person, place, and time.     ED  Results and Treatments Labs (all labs ordered are listed, but only abnormal results are displayed) Labs Reviewed  CBC WITH DIFFERENTIAL/PLATELET - Abnormal; Notable for the following components:      Result Value   RBC 3.32 (*)    Hemoglobin 4.3 (*)    HCT 18.2 (*)    MCV 54.8 (*)    MCH 13.0 (*)    MCHC 23.6 (*)    RDW 23.7 (*)    Platelets 479 (*)    nRBC 0.6 (*)    Basophils Absolute 0.2 (*)    nRBC 2 (*)    All other components within normal limits  BASIC METABOLIC PANEL - Abnormal; Notable for the following components:   Sodium 132 (*)    CO2 20 (*)    Glucose, Bld 112 (*)    BUN 21 (*)    Creatinine, Ser 1.14 (*)    GFR, Estimated 58 (*)    All other components within normal limits  PATHOLOGIST SMEAR REVIEW  TYPE AND SCREEN  PREPARE RBC (CROSSMATCH)                                                                                                                         EKG  EKG Interpretation Date/Time:    Ventricular Rate:    PR Interval:    QRS Duration:    QT Interval:    QTC Calculation:   R Axis:      Text Interpretation:  Radiology No results found.  Medications Ordered in ED Medications  0.9 %  sodium chloride infusion (Manually program via Guardrails IV Fluids) (0 mLs Intravenous Stopped 10/31/22 0143)   Procedures .Critical Care  Performed by: Nira Conn, MD Authorized by: Nira Conn, MD   Critical care provider statement:    Critical care time (minutes):  30   Critical care was necessary to treat or prevent imminent or life-threatening deterioration of the following conditions: Hb 4.3 requiring transfusion.   Critical care was time spent personally by me on the following activities:  Development of treatment plan with patient or surrogate, discussions with consultants, evaluation of patient's response to treatment, examination of patient, ordering and review of laboratory studies, ordering and review of  radiographic studies, ordering and performing treatments and interventions, pulse oximetry, re-evaluation of patient's condition and review of old charts   (including critical care time) Medical Decision Making / ED Course   Medical Decision Making Problems Addressed: Chronic iron deficiency anemia:    Details: Acute on chronic   Amount and/or Complexity of Data Reviewed Labs: ordered. Decision-making details documented in ED Course.  Risk Prescription drug management.    Patient is here for low hemoglobin noted on routine labs. CBC confirmed hemoglobin of 4.3.  Microcytic and consistent with likely iron deficiency anemia given history. BMP without significant electrolyte derangements.  Mild renal insufficiency without AKI. Plan for 2 units of PRBCs. Anticipate discharge      7:11 AM Transfusions Complete patient did not have any complications.   Final Clinical Impression(s) / ED Diagnoses Final diagnoses:  Chronic iron deficiency anemia   The patient appears reasonably screened and/or stabilized for discharge and I doubt any other medical condition or other [facility] requiring further screening, evaluation, or treatment in the ED at this time. I have discussed the findings, Dx and Tx plan with the patient/family who expressed understanding and agree(s) with the plan. Discharge instructions discussed at length. The patient/family was given strict return precautions who verbalized understanding of the instructions. No further questions at time of discharge.  Disposition: Discharge  Condition: Good  ED Discharge Orders     None         Follow Up: [place] [address] [place] [address] [phone]  Call  to schedule an appointment for close follow up    This chart was dictated using voice recognition software.  Despite best efforts to proofread,  errors can occur which can change the documentation meaning.    Nira Conn, MD 10/31/22  [phone]

## 2022-10-31 NOTE — Telephone Encounter (Signed)
Patient presented to the ED for 2 units PRBC.  Thank you Dr. Benito Mccreedy.

## 2022-10-31 NOTE — Discharge Instructions (Addendum)
Thank you for allowing [place] to take care of you today.  We hope you begin feeling better soon.  To-Do: Please follow-up with your primary doctor. Please return to the Emergency Department or call 911 if you experience chest pain, shortness of breath, severe pain, severe fever, altered mental status, or have any reason to think that you need emergency medical care.  Thank you again.  Hope you feel better soon.  Department of Emergency Medicine [facility]

## 2022-11-01 LAB — BPAM RBC
Blood Product Expiration Date: 202410062359
Blood Product Expiration Date: 202410102359
ISSUE DATE / TIME: 202409160045
ISSUE DATE / TIME: 202409160315
Unit Type and Rh: 7300
Unit Type and Rh: 7300

## 2022-11-01 LAB — TYPE AND SCREEN
ABO/RH(D): B POS
Antibody Screen: NEGATIVE
Unit division: 0
Unit division: 0

## 2022-11-01 IMAGING — US US BREAST*L* LIMITED INC AXILLA
1 series · 5 of 5 positions shown · non-contrast
Comparison: Previous exam(s).

CLINICAL DATA: 49-year-old female for six-month follow-up of LEFT
breast mass.

EXAM:
ULTRASOUND OF THE LEFT BREAST

[Series 1: us breast*left* limited inc axilla · 0.07mm/px · 5 of 5 slices shown]
[im 1/5]
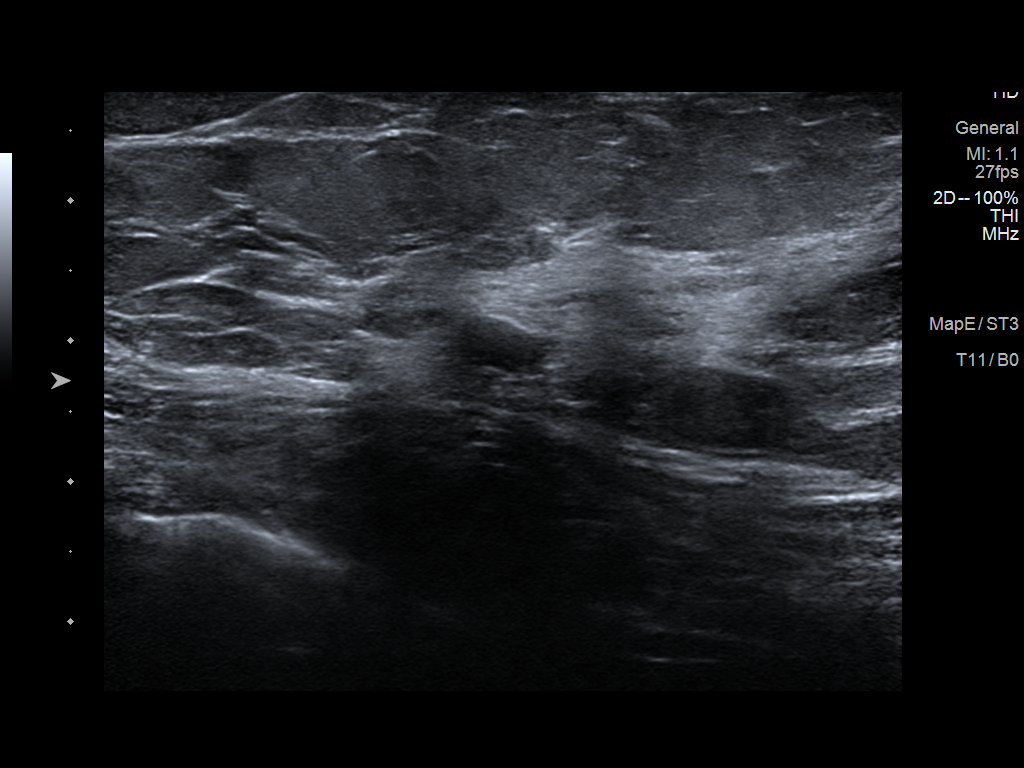
[im 2/5]
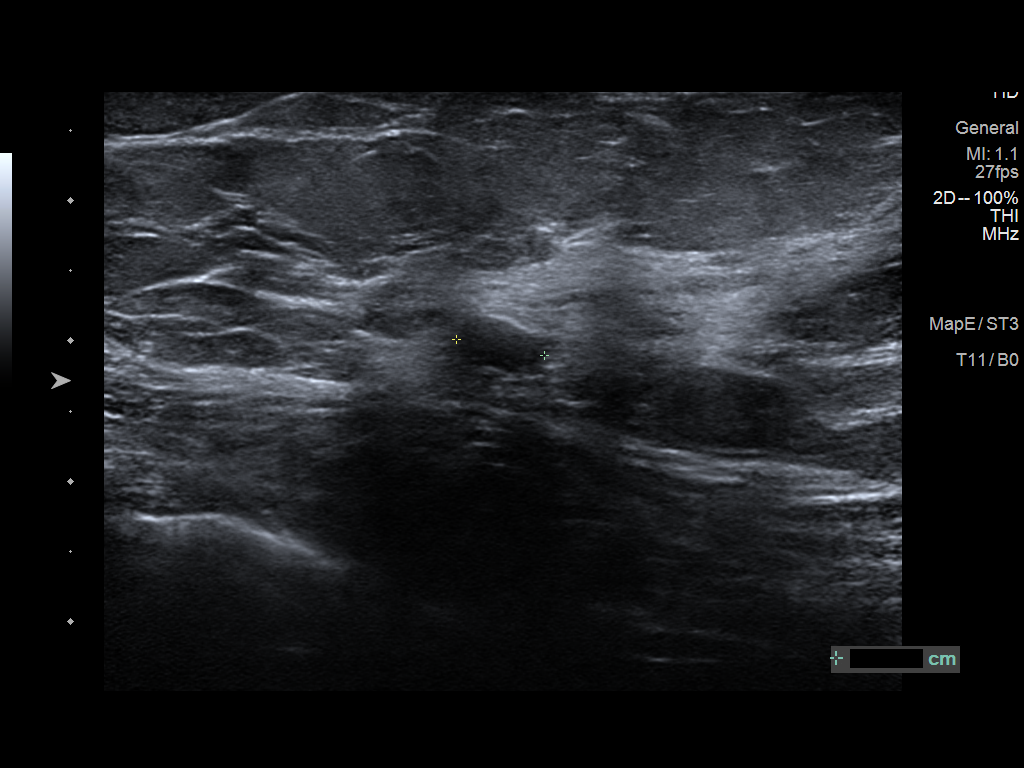
[im 3/5]
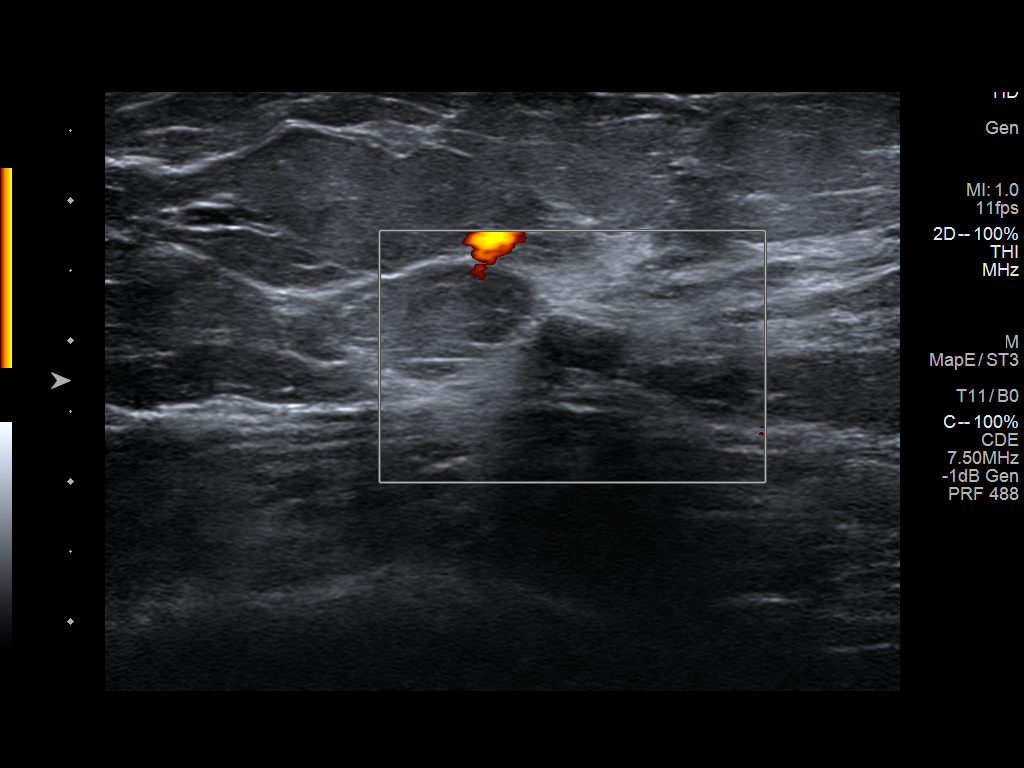
[im 4/5]
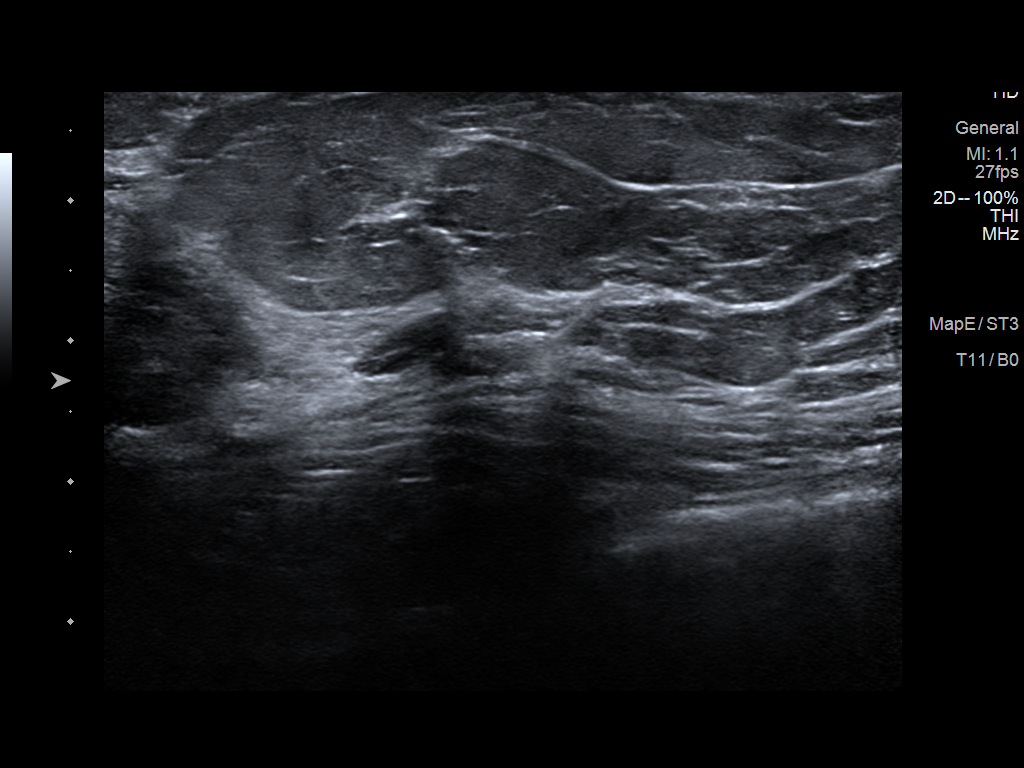
[im 5/5]
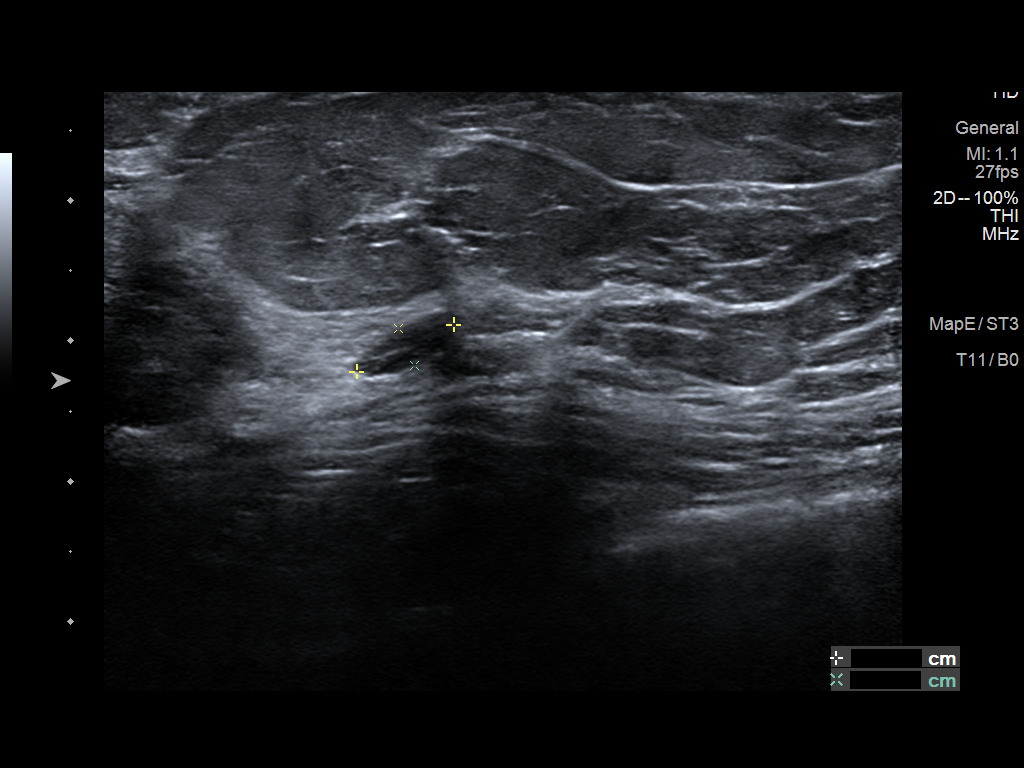

[5 of 5 positions shown; findings below may reference images not displayed]

FINDINGS: Targeted ultrasound is performed, showing a stable 0.8 x 0.3 x
cm circumscribed oval hypoechoic parallel mass at the 2 o'clock
position of the LEFT breast 6 cm from the nipple.
IMPRESSION: 1. Stable likely benign LEFT breast mass. Six-month follow-up
recommended to ensure 1 year stability.

RECOMMENDATION:
Bilateral diagnostic mammogram and LEFT breast ultrasound in 6
months.

I have discussed the findings and recommendations with the patient.
If applicable, a reminder letter will be sent to the patient
regarding the next appointment.

BI-RADS CATEGORY  3: Probably benign.

## 2022-11-17 ENCOUNTER — Encounter: Payer: BC Managed Care – PPO | Admitting: Student

## 2022-12-14 ENCOUNTER — Encounter: Payer: Self-pay | Admitting: *Deleted

## 2022-12-28 ENCOUNTER — Other Ambulatory Visit: Payer: Self-pay

## 2022-12-28 ENCOUNTER — Ambulatory Visit (INDEPENDENT_AMBULATORY_CARE_PROVIDER_SITE_OTHER): Payer: BC Managed Care – PPO | Admitting: Internal Medicine

## 2022-12-28 ENCOUNTER — Encounter: Payer: Self-pay | Admitting: Internal Medicine

## 2022-12-28 VITALS — BP 139/83 | HR 46 | Temp 97.7°F | Ht 65.0 in | Wt 167.0 lb

## 2022-12-28 DIAGNOSIS — J309 Allergic rhinitis, unspecified: Secondary | ICD-10-CM | POA: Diagnosis not present

## 2022-12-28 DIAGNOSIS — D500 Iron deficiency anemia secondary to blood loss (chronic): Secondary | ICD-10-CM

## 2022-12-28 DIAGNOSIS — J3089 Other allergic rhinitis: Secondary | ICD-10-CM

## 2022-12-28 DIAGNOSIS — L309 Dermatitis, unspecified: Secondary | ICD-10-CM | POA: Diagnosis not present

## 2022-12-28 DIAGNOSIS — L308 Other specified dermatitis: Secondary | ICD-10-CM

## 2022-12-28 DIAGNOSIS — N8500 Endometrial hyperplasia, unspecified: Secondary | ICD-10-CM

## 2022-12-28 DIAGNOSIS — E039 Hypothyroidism, unspecified: Secondary | ICD-10-CM

## 2022-12-28 DIAGNOSIS — R001 Bradycardia, unspecified: Secondary | ICD-10-CM | POA: Insufficient documentation

## 2022-12-28 DIAGNOSIS — D509 Iron deficiency anemia, unspecified: Secondary | ICD-10-CM | POA: Diagnosis not present

## 2022-12-28 DIAGNOSIS — E7800 Pure hypercholesterolemia, unspecified: Secondary | ICD-10-CM

## 2022-12-28 NOTE — Assessment & Plan Note (Signed)
She notes this chronic, has been lifelong.  She is not sure why.  She feels she used to have heavy periods, but not any longer.  She has had a D&C before and she has had pap smears with atypia (due next year), but has never known exactly why she gets anemic.  The only symptom she had when Hgb was around 4 was worsening fatigue than normal.  Today, she feels normal, has been able to exercise as normal.   Plan Check CBC and iron panel Consider work up with hematology depending on results.

## 2022-12-28 NOTE — Progress Notes (Signed)
Established Patient Office Visit  Subjective   Patient ID: Angela Huerta, female    DOB: [birth year]  Age: 52 y.o. MRN: [id number]  Chief Complaint  Patient presents with   Transitions Of Care    ED F/U     Angela Huerta is a 52 year old woman with history of hypothyroidism, anemia, atopic dermatitis, and elevated LDL on last check who presents for ED follow up.   She was  noted to have very low Hgb (4) on lab work and went to the ED for evaluation.  She received 2 units of PRBC and improved.  She noted that the only symptom was fatigue outside of her normal.  She notes always being anemic and her baseline being around 6-7.  She does not know why and does not have a formal diagnosis for this.  She is very active physically and functions normally at this level, per her.    She follows with OB/Gyn and has been recommended a D&C for endometrial hyperplasia, and she is considering this.  She is due for PAP next year.  She has been having fewer periods per year.      Review of Systems  Constitutional:  Negative for chills, diaphoresis, fever, malaise/fatigue and weight loss.  Respiratory:  Negative for cough and shortness of breath.   Cardiovascular:  Negative for chest pain and leg swelling.  Gastrointestinal:  Negative for blood in stool and heartburn.  Genitourinary:  Negative for dysuria and hematuria.  Musculoskeletal:  Negative for back pain, myalgias and neck pain.  Neurological:  Negative for dizziness, sensory change and weakness.      Objective:     BP 139/83 (BP Location: Left Arm, Cuff Size: Normal)   Pulse (!) 46   Temp 97.7 F (36.5 C) (Oral)   Ht 5\' 5"  (1.651 m)   Wt 167 lb (75.8 kg)   SpO2 100%   BMI 27.79 kg/m  BP Readings from Last 3 Encounters:  12/28/22 139/83  10/31/22 (!) 143/71  05/03/22 120/69   Wt Readings from Last 3 Encounters:  12/28/22 167 lb (75.8 kg)  10/30/22 162 lb (73.5 kg)  05/03/22 165 lb 8 oz (75.1 kg)      Physical  Exam Vitals and nursing note reviewed.  Constitutional:      General: She is not in acute distress.    Appearance: Normal appearance. She is not toxic-appearing.  HENT:     Head: Normocephalic and atraumatic.  Eyes:     General:        Right eye: No discharge.        Left eye: No discharge.     Conjunctiva/sclera: Conjunctivae normal.     Comments: No conjunctival pallor  Cardiovascular:     Rate and Rhythm: Normal rate and regular rhythm.     Heart sounds: Normal heart sounds. No murmur heard. Pulmonary:     Effort: Pulmonary effort is normal. No respiratory distress.  Abdominal:     General: Abdomen is flat. Bowel sounds are normal. There is no distension.  Neurological:     Mental Status: She is alert and oriented to person, place, and time. Mental status is at baseline.  Psychiatric:        Mood and Affect: Mood normal.        Behavior: Behavior normal.      No results found for any visits on 12/28/22.    The 10-year ASCVD risk score (Arnett DK, et al., 2019) is: 3.2%  Assessment & Plan:   Problem List Items Addressed This Visit       Unprioritized   Hypothyroidism - Primary    At last check TSH was lower than expected.  She feels no symptoms, no palpitations and actually has bradycardia at baseline.  We will check again today.   Plan Check TSH Decrease synthroid to 50 mcg if still low.       Relevant Orders   TSH   Hepatitis C Ab reflex to Quant PCR   Allergic rhinitis    Currently not an issue.  She will monitor for worsening of symptoms.  OTC medications would be useful if she needs them.       Eczema    Uses intermittent creams with good results, no concerns today.       Iron deficiency anemia    She notes this chronic, has been lifelong.  She is not sure why.  She feels she used to have heavy periods, but not any longer.  She has had a D&C before and she has had pap smears with atypia (due next year), but has never known exactly why she gets  anemic.  The only symptom she had when Hgb was around 4 was worsening fatigue than normal.  Today, she feels normal, has been able to exercise as normal.   Plan Check CBC and iron panel Consider work up with hematology depending on results.       Relevant Orders   CBC no Diff   Iron, TIBC and Ferritin Panel   Hepatitis C Ab reflex to Quant PCR   Endometrial hyperplasia without atypia    She follows with gynecology and notes that they have recommended a D&C.  She is not sure if she wants to go through with this procedure.  She has seen another Theatre manager for a 2nd opinion (Dr. Ernestina Huerta).  I asked her to schedule these appointments and she is thinking about it.       Bradycardia    Noted at baseline.  She is asymptomatic.  Monitor.       Other Visit Diagnoses     Elevated LDL cholesterol level       Relevant Orders   CMP14 + Anion Gap   Lipid Profile   Hepatitis C Ab reflex to Quant PCR       Return in about 6 months (around 06/27/2023).    Debe Coder, MD

## 2022-12-28 NOTE — Assessment & Plan Note (Signed)
Noted at baseline.  She is asymptomatic.  Monitor.

## 2022-12-28 NOTE — Assessment & Plan Note (Signed)
Currently not an issue.  She will monitor for worsening of symptoms.  OTC medications would be useful if she needs them.

## 2022-12-28 NOTE — Assessment & Plan Note (Signed)
She follows with gynecology and notes that they have recommended a D&C.  She is not sure if she wants to go through with this procedure.  She has seen another Theatre manager for a 2nd opinion (Dr. Ernestina Penna).  I asked her to schedule these appointments and she is thinking about it.

## 2022-12-28 NOTE — Assessment & Plan Note (Signed)
Uses intermittent creams with good results, no concerns today.

## 2022-12-28 NOTE — Assessment & Plan Note (Signed)
At last check TSH was lower than expected.  She feels no symptoms, no palpitations and actually has bradycardia at baseline.  We will check again today.   Plan Check TSH Decrease synthroid to 50 mcg if still low.

## 2022-12-28 NOTE — Patient Instructions (Signed)
Ms. Gladstone - -  Please keep taking your medications as you have been.   I will call you with the results of your blood work today!  You can come back to be seen in 6 months, sooner if you need!  Thank you!

## 2022-12-29 LAB — IRON,TIBC AND FERRITIN PANEL
Ferritin: 22 ng/mL (ref 15–150)
Iron Saturation: 35 % (ref 15–55)
Iron: 150 ug/dL (ref 27–159)
Total Iron Binding Capacity: 428 ug/dL (ref 250–450)
UIBC: 278 ug/dL (ref 131–425)

## 2022-12-29 LAB — CBC
Hematocrit: 38.8 % (ref 34.0–46.6)
Hemoglobin: 10.7 g/dL — ABNORMAL LOW (ref 11.1–15.9)
MCH: 21.2 pg — ABNORMAL LOW (ref 26.6–33.0)
MCHC: 27.6 g/dL — ABNORMAL LOW (ref 31.5–35.7)
MCV: 77 fL — ABNORMAL LOW (ref 79–97)
Platelets: 283 10*3/uL (ref 150–450)
RBC: 5.05 x10E6/uL (ref 3.77–5.28)
RDW: 23.1 % — ABNORMAL HIGH (ref 11.7–15.4)
WBC: 4.1 10*3/uL (ref 3.4–10.8)

## 2022-12-29 LAB — HCV AB W REFLEX TO QUANT PCR: HCV Ab: NONREACTIVE

## 2022-12-29 LAB — LIPID PANEL
Chol/HDL Ratio: 2.6 ratio (ref 0.0–4.4)
Cholesterol, Total: 201 mg/dL — ABNORMAL HIGH (ref 100–199)
HDL: 78 mg/dL (ref >39)
LDL Chol Calc (NIH): 114 mg/dL — ABNORMAL HIGH (ref 0–99)
Triglycerides: 49 mg/dL (ref 0–149)
VLDL Cholesterol Cal: 9 mg/dL (ref 5–40)

## 2022-12-29 LAB — CMP14 + ANION GAP
ALT: 14 [IU]/L (ref 0–32)
AST: 23 [IU]/L (ref 0–40)
Albumin: 4.2 g/dL (ref 3.8–4.9)
Alkaline Phosphatase: 129 [IU]/L — ABNORMAL HIGH (ref 44–121)
Anion Gap: 17 mmol/L (ref 10.0–18.0)
BUN/Creatinine Ratio: 16 (ref 9–23)
BUN: 13 mg/dL (ref 6–24)
Bilirubin Total: <0.2 mg/dL (ref 0.0–1.2)
CO2: 20 mmol/L (ref 20–29)
Calcium: 9.5 mg/dL (ref 8.7–10.2)
Chloride: 103 mmol/L (ref 96–106)
Creatinine, Ser: 0.81 mg/dL (ref 0.57–1.00)
Globulin, Total: 3.3 g/dL (ref 1.5–4.5)
Glucose: 98 mg/dL (ref 70–99)
Potassium: 4.9 mmol/L (ref 3.5–5.2)
Sodium: 140 mmol/L (ref 134–144)
Total Protein: 7.5 g/dL (ref 6.0–8.5)
eGFR: 87 mL/min/{1.73_m2} (ref >59)

## 2022-12-29 LAB — TSH: TSH: 1.88 u[IU]/mL (ref 0.450–4.500)

## 2022-12-29 LAB — HCV INTERPRETATION

## 2022-12-30 ENCOUNTER — Encounter: Payer: BC Managed Care – PPO | Admitting: Internal Medicine

## 2023-04-06 ENCOUNTER — Other Ambulatory Visit: Payer: Self-pay | Admitting: Internal Medicine

## 2023-04-06 DIAGNOSIS — E039 Hypothyroidism, unspecified: Secondary | ICD-10-CM

## 2023-04-06 NOTE — Telephone Encounter (Signed)
 Medication sent to pharmacy

## 2023-04-10 ENCOUNTER — Other Ambulatory Visit: Payer: Self-pay

## 2023-04-10 DIAGNOSIS — E669 Obesity, unspecified: Secondary | ICD-10-CM

## 2023-04-10 DIAGNOSIS — N8500 Endometrial hyperplasia, unspecified: Secondary | ICD-10-CM

## 2023-04-10 MED ORDER — METFORMIN HCL ER 500 MG PO TB24: 500.0000 mg | ORAL_TABLET | Freq: Every day | ORAL | 11 refills | Status: AC

## 2023-07-13 ENCOUNTER — Ambulatory Visit (INDEPENDENT_AMBULATORY_CARE_PROVIDER_SITE_OTHER): Payer: Self-pay | Admitting: Student

## 2023-07-13 VITALS — BP 128/79 | HR 57 | Temp 98.7°F | Ht 65.0 in | Wt 169.0 lb

## 2023-07-13 DIAGNOSIS — E039 Hypothyroidism, unspecified: Secondary | ICD-10-CM | POA: Diagnosis not present

## 2023-07-13 DIAGNOSIS — D500 Iron deficiency anemia secondary to blood loss (chronic): Secondary | ICD-10-CM | POA: Diagnosis not present

## 2023-07-13 DIAGNOSIS — R030 Elevated blood-pressure reading, without diagnosis of hypertension: Secondary | ICD-10-CM

## 2023-07-13 DIAGNOSIS — E785 Hyperlipidemia, unspecified: Secondary | ICD-10-CM | POA: Diagnosis not present

## 2023-07-13 DIAGNOSIS — L308 Other specified dermatitis: Secondary | ICD-10-CM

## 2023-07-13 DIAGNOSIS — N8500 Endometrial hyperplasia, unspecified: Secondary | ICD-10-CM

## 2023-07-13 DIAGNOSIS — R7303 Prediabetes: Secondary | ICD-10-CM

## 2023-07-13 DIAGNOSIS — Z0000 Encounter for general adult medical examination without abnormal findings: Secondary | ICD-10-CM

## 2023-07-13 LAB — POCT GLYCOSYLATED HEMOGLOBIN (HGB A1C): Hemoglobin A1C: 5.6 % (ref 4.0–5.6)

## 2023-07-13 LAB — GLUCOSE, CAPILLARY: Glucose-Capillary: 105 mg/dL — ABNORMAL HIGH (ref 70–99)

## 2023-07-13 MED ORDER — TRIAMCINOLONE ACETONIDE 0.1 % EX CREA
1.0000 | TOPICAL_CREAM | Freq: Two times a day (BID) | CUTANEOUS | 3 refills | Status: DC
Start: 2023-07-13 — End: 2023-08-15

## 2023-07-13 MED ORDER — LEVOTHYROXINE SODIUM 75 MCG PO TABS: 75.0000 ug | ORAL_TABLET | Freq: Every day | ORAL | 0 refills | Status: DC

## 2023-07-13 NOTE — Assessment & Plan Note (Signed)
 Patient last seen 12/28/2022.  Currently takes 75 mcg of Synthroid  daily.  Notes good adherence.  Denies any signs or symptoms.  TSH checked at this time normal at 1.88. - Continue Synthroid  75 mcg daily

## 2023-07-13 NOTE — Assessment & Plan Note (Signed)
 Blood pressure elevated today with systolic in 150s.  Patient notes this is common whenever she was at the doctor's office.  At home, her blood pressure is in the 120s.  Will continue to monitor.

## 2023-07-13 NOTE — Assessment & Plan Note (Signed)
 Lipid panel at last appointment elevated at 114.  10-year ASCVD risk is 2.4%.  No indication for treatment at this time but will continue to treat with lifestyle and dietary modifications.  Would suggest repeating at her follow-up in 6 months. - Follow-up open profile at next visit in 6 months

## 2023-07-13 NOTE — Assessment & Plan Note (Signed)
 Follows with gynecology.  At her last visit, she was on clear if she wanted to proceed with D&C. Has appointment with Dr. Tedra Fears in July.

## 2023-07-13 NOTE — Patient Instructions (Signed)
 Thank you so much for coming to the clinic today!   I will call back with your labs.  We will see you in six months.   If you have any questions please feel free to the call the clinic at anytime at [phone]. It was a pleasure seeing you!  Best, Dr. Carolee Churchman

## 2023-07-13 NOTE — Progress Notes (Signed)
 CC: Chronic condition follow-up  HPI: [name] is a 53 y.o. female living with a history stated below and presents today for chronic condition follow-up. Please see problem based assessment and plan for additional details.  Past Medical History:  Diagnosis Date   Alopecia    Eczema    Fibroids    Hypothyroidism    Dx as part of intertility W/U   Miscarriage    Seasonal allergies     Current Outpatient Medications on File Prior to Visit  Medication Sig Dispense Refill   metFORMIN  (GLUCOPHAGE -XR) 500 MG 24 hr tablet Take 1 tablet (500 mg total) by mouth daily with breakfast. 30 tablet 11   Multiple Vitamin (MULTIVITAMIN WITH MINERALS) TABS tablet Take 1 tablet by mouth daily.     No current facility-administered medications on file prior to visit.    Family History  Problem Relation Age of Onset   Fibroids Mother    Diabetes Paternal Aunt    Pancreatic cancer Maternal Grandfather    Heart disease Paternal Grandmother    Colon cancer Neg Hx     Social History   Socioeconomic History   Marital status: Single    Spouse name: Not on file   Number of children: Not on file   Years of education: Not on file   Highest education level: Not on file  Occupational History   Not on file  Tobacco Use   Smoking status: Former   Smokeless tobacco: Never   Tobacco comments:    quit in 2000  Substance and Sexual Activity   Alcohol use: Never    Comment: couple drinks per year   Drug use: Never   Sexual activity: Not on file  Other Topics Concern   Not on file  Social History Narrative   Not on file   Social Drivers of Health   Financial Resource Strain: Medium Risk (12/28/2022)   Overall Financial Resource Strain (CARDIA)    Difficulty of Paying Living Expenses: Somewhat hard  Food Insecurity: No Food Insecurity (12/28/2022)   Hunger Vital Sign    Worried About Running Out of Food in the Last Year: Never true    Ran Out of Food in the Last Year: Never  true  Transportation Needs: No Transportation Needs (12/28/2022)   PRAPARE - Administrator, Civil Service (Medical): No    Lack of Transportation (Non-Medical): No  Physical Activity: Sufficiently Active (12/28/2022)   Exercise Vital Sign    Days of Exercise per Week: 4 days    Minutes of Exercise per Session: 60 min  Stress: No Stress Concern Present (12/28/2022)   [employer] of Occupational Health - Occupational Stress Questionnaire    Feeling of Stress : Only a little  Social Connections: Socially Isolated (12/28/2022)   Social Connection and Isolation Panel [NHANES]    Frequency of Communication with Friends and Family: More than three times a week    Frequency of Social Gatherings with Friends and Family: Three times a week    Attends Religious Services: Never    Active Member of Clubs or Organizations: No    Attends Banker Meetings: Never    Marital Status: Widowed  Intimate Partner Violence: Not At Risk (12/28/2022)   Humiliation, Afraid, Rape, and Kick questionnaire    Fear of Current or Ex-Partner: No    Emotionally Abused: No    Physically Abused: No    Sexually Abused: No    Review of Systems: ROS negative  except for what is noted on the assessment and plan.  Vitals:   07/13/23 0850 07/13/23 0915  BP: (!) 151/74 128/79  Pulse: 61 (!) 57  Temp: 98.7 F (37.1 C)   TempSrc: Oral   SpO2: 99%   Weight: 169 lb (76.7 kg)   Height: 5\' 5"  (1.651 m)     Physical Exam: Constitutional: well-appearing in no acute distress HENT: normocephalic atraumatic, mucous membranes moist Eyes: conjunctiva non-erythematous Neck: supple Cardiovascular: regular rate and rhythm, no m/r/g Pulmonary/Chest: normal work of breathing on room air, lungs clear to auscultation bilaterally Abdominal: soft, non-tender, non-distended MSK: normal bulk and tone Neurological: alert & oriented x 3, 5/5 strength in bilateral upper and lower extremities, normal  gait Skin: warm and dry  Assessment & Plan:   Hypothyroidism Patient last seen 12/28/2022.  Currently takes 75 mcg of Synthroid  daily.  Notes good adherence.  Denies any signs or symptoms.  TSH checked at this time normal at 1.88. - Continue Synthroid  75 mcg daily  Iron deficiency anemia Unclear etiology but has been a problem throughout her life.  CBC checked at her last visit notable for hemoglobin of 10.7 which is roughly her baseline.  Her MCV was 77.  Iron panel unremarkable. Cycles more stable and not heavy. Today feels more fatigued and feels that period has been longer this month.  - Follow-up CBC, iron panel  Endometrial hyperplasia without atypia Follows with gynecology.  At her last visit, she was on clear if she wanted to proceed with D&C. Has appointment with Dr. Tedra Fears in July.   Hyperlipidemia Lipid panel at last appointment elevated at 114.  10-year ASCVD risk is 2.4%.  No indication for treatment at this time but will continue to treat with lifestyle and dietary modifications.  Would suggest repeating at her follow-up in 6 months. - Follow-up open profile at next visit in 6 months  White coat syndrome without diagnosis of hypertension Blood pressure elevated today with systolic in 150s.  Patient notes this is common whenever she was at the doctor's office.  At home, her blood pressure is in the 120s.  Will continue to monitor.  Health care maintenance Currently taking metformin  500 mg daily. Notes she takes this for the endometrial hyperplasia. Doesn't want to come off of the medication. Last A1c 4 years ago was 5.1 so will recheck today.  - F/U A1c  Patient discussed with Dr. Brigitte Canard, MD  [facility] Internal Medicine, PGY-1 Date 07/13/2023 Time 9:25 AM

## 2023-07-13 NOTE — Progress Notes (Signed)
 Internal Medicine Clinic Attending  Case discussed with the resident at the time of the visit.  We reviewed the resident's history and exam and pertinent patient test results.  I agree with the assessment, diagnosis, and plan of care documented in the resident's note.

## 2023-07-13 NOTE — Assessment & Plan Note (Signed)
 Currently taking metformin  500 mg daily. Notes she takes this for the endometrial hyperplasia. Doesn't want to come off of the medication. Last A1c 4 years ago was 5.1 so will recheck today.  - F/U A1c

## 2023-07-13 NOTE — Assessment & Plan Note (Signed)
 Unclear etiology but has been a problem throughout her life.  CBC checked at her last visit notable for hemoglobin of 10.7 which is roughly her baseline.  Her MCV was 77.  Iron panel unremarkable. Cycles more stable and not heavy. Today feels more fatigued and feels that period has been longer this month.  - Follow-up CBC, iron panel

## 2023-07-14 ENCOUNTER — Other Ambulatory Visit: Payer: Self-pay | Admitting: Student

## 2023-07-14 ENCOUNTER — Ambulatory Visit: Payer: Self-pay | Admitting: Student

## 2023-07-14 DIAGNOSIS — D500 Iron deficiency anemia secondary to blood loss (chronic): Secondary | ICD-10-CM

## 2023-07-14 LAB — CBC
Hematocrit: 27.9 % — ABNORMAL LOW (ref 34.0–46.6)
Hemoglobin: 7.2 g/dL — ABNORMAL LOW (ref 11.1–15.9)
MCH: 18.6 pg — ABNORMAL LOW (ref 26.6–33.0)
MCHC: 25.8 g/dL — ABNORMAL LOW (ref 31.5–35.7)
MCV: 72 fL — ABNORMAL LOW (ref 79–97)
NRBC: 1 % — ABNORMAL HIGH (ref 0–0)
Platelets: 269 10*3/uL (ref 150–450)
RBC: 3.88 x10E6/uL (ref 3.77–5.28)
RDW: 21.8 % — ABNORMAL HIGH (ref 11.7–15.4)
WBC: 5.1 10*3/uL (ref 3.4–10.8)

## 2023-07-14 LAB — IRON,TIBC AND FERRITIN PANEL
Ferritin: 20 ng/mL (ref 15–150)
Iron Saturation: 4 % — CL (ref 15–55)
Iron: 17 ug/dL — ABNORMAL LOW (ref 27–159)
Total Iron Binding Capacity: 407 ug/dL (ref 250–450)
UIBC: 390 ug/dL (ref 131–425)

## 2023-07-14 NOTE — Progress Notes (Signed)
 Called patient to discuss lab results. Hemoglobin low at 7.2. Patient encouraged to repeat CBC this AM but unable to get to lab. She is current asymptomatic. She was encouraged to follow up with the ED or urgent care if she were to become symptomatic as she may need a repeat CBC and possible transfusion. Otherwise, she will come back to the clinic on Monday for a lab only visit to repeat this value.

## 2023-07-16 NOTE — Progress Notes (Signed)
 Sent patient message to further discuss plan. OP iron infusion ordered as well.

## 2023-07-17 ENCOUNTER — Other Ambulatory Visit (INDEPENDENT_AMBULATORY_CARE_PROVIDER_SITE_OTHER)

## 2023-07-17 ENCOUNTER — Telehealth: Payer: Self-pay

## 2023-07-17 ENCOUNTER — Telehealth: Payer: Self-pay | Admitting: Internal Medicine

## 2023-07-17 DIAGNOSIS — D500 Iron deficiency anemia secondary to blood loss (chronic): Secondary | ICD-10-CM | POA: Insufficient documentation

## 2023-07-17 NOTE — Telephone Encounter (Signed)
 Patient referred to infusion pharmacy team for ambulatory infusion of IV iron.  Insurance - BCBS  Dx code - D50.0 IV Iron Therapy - Feraheme  510 mg IV x 2  Infusion appointments - [facility] Scheduling team will schedule patient as soon as possible.   Shadonna Benedick D. Elek Holderness, PharmD

## 2023-07-17 NOTE — Telephone Encounter (Signed)
 Auth Submission: NO AUTH NEEDED Site of care: Site of care: [facility] Payer: BCBS of [place]  Medication & CPT/J Code(s) submitted: Feraheme  (ferumoxytol ) [phone] Route of submission (phone, fax, portal): phone Phone # [phone] Fax # Auth type: Buy/Bill PB Units/visits requested: 510mg  x 2 doses Reference number:  Approval from: 07/17/23 to 11/16/23

## 2023-07-18 ENCOUNTER — Ambulatory Visit: Payer: Self-pay | Admitting: Student

## 2023-07-18 ENCOUNTER — Telehealth: Payer: Self-pay | Admitting: *Deleted

## 2023-07-18 LAB — CBC
Hematocrit: 27.4 % — ABNORMAL LOW (ref 34.0–46.6)
Hemoglobin: 7.4 g/dL — ABNORMAL LOW (ref 11.1–15.9)
MCH: 19 pg — ABNORMAL LOW (ref 26.6–33.0)
MCHC: 27 g/dL — ABNORMAL LOW (ref 31.5–35.7)
MCV: 70 fL — ABNORMAL LOW (ref 79–97)
Platelets: 231 10*3/uL (ref 150–450)
RBC: 3.89 x10E6/uL (ref 3.77–5.28)
RDW: 22.7 % — ABNORMAL HIGH (ref 11.7–15.4)
WBC: 4 10*3/uL (ref 3.4–10.8)

## 2023-07-18 NOTE — Progress Notes (Signed)
 Attempted to call patient to discuss lab results. Hgb stable but reached out to [facility] for scheduling follow-up this week. Will send message to patient noting stable Hgb but that she needs to follow up with the clinic for further treatment.

## 2023-07-18 NOTE — Telephone Encounter (Signed)
 Please review abnormal lab results from 07/17/2023.

## 2023-07-27 ENCOUNTER — Ambulatory Visit

## 2023-07-27 VITALS — BP 158/89 | HR 51 | Temp 98.9°F | Resp 16 | Ht 65.0 in | Wt 169.8 lb

## 2023-07-27 DIAGNOSIS — D500 Iron deficiency anemia secondary to blood loss (chronic): Secondary | ICD-10-CM

## 2023-07-27 DIAGNOSIS — D509 Iron deficiency anemia, unspecified: Secondary | ICD-10-CM | POA: Diagnosis not present

## 2023-07-27 MED ORDER — SODIUM CHLORIDE 0.9 % IV SOLN
510.0000 mg | Freq: Once | INTRAVENOUS | Status: AC
  Administered 2023-07-27: 510 mg via INTRAVENOUS
  Filled 2023-07-27: qty 17

## 2023-07-27 NOTE — Progress Notes (Deleted)
 Diagnosis: Iron Deficiency Anemia  Provider:  Praveen Mannam MD  Procedure: IV Infusion  IV Type: Peripheral, IV Location: R Antecubital  Feraheme  (Ferumoxytol ), Dose: 510 mg  Infusion Start Time: 1133  Infusion Stop Time: 1157  Post Infusion IV Care: {[facility] Post Infusion:25398}  Discharge: {Condition:19696:::1}, {Destination:18313::Home:1} . {CHINFAVS:28985}  Performed by:  Devonne Folk, RN

## 2023-07-27 NOTE — Progress Notes (Signed)
 Diagnosis: Iron Deficiency Anemia  Provider:  Praveen Mannam MD  Procedure: IV Infusion  IV Type: Peripheral, IV Location: R Antecubital  Feraheme  (Ferumoxytol ), Dose: 510 mg  Infusion Start Time: 1133  Infusion Stop Time: 1155  Post Infusion IV Care: Observation period completed and Peripheral IV Discontinued  Discharge: Condition: Good, Destination: Home . AVS Provided  Performed by:  Lauran Pollard, LPN

## 2023-08-02 ENCOUNTER — Other Ambulatory Visit: Payer: Self-pay | Admitting: Internal Medicine

## 2023-08-02 DIAGNOSIS — Z1231 Encounter for screening mammogram for malignant neoplasm of breast: Secondary | ICD-10-CM

## 2023-08-03 ENCOUNTER — Ambulatory Visit: Admitting: *Deleted

## 2023-08-03 VITALS — BP 135/85 | HR 56 | Temp 98.1°F | Resp 16 | Ht 65.0 in | Wt 165.8 lb

## 2023-08-03 DIAGNOSIS — D509 Iron deficiency anemia, unspecified: Secondary | ICD-10-CM

## 2023-08-03 DIAGNOSIS — D500 Iron deficiency anemia secondary to blood loss (chronic): Secondary | ICD-10-CM

## 2023-08-03 MED ORDER — SODIUM CHLORIDE 0.9 % IV SOLN
510.0000 mg | Freq: Once | INTRAVENOUS | Status: AC
  Administered 2023-08-03: 510 mg via INTRAVENOUS
  Filled 2023-08-03: qty 17

## 2023-08-03 NOTE — Progress Notes (Signed)
 Diagnosis: Iron Deficiency Anemia  Provider:  Mannam, Praveen MD  Procedure: IV Infusion  IV Type: Peripheral, IV Location: L Antecubital  Feraheme  (Ferumoxytol ), Dose: 510 mg  Infusion Start Time: 1202 pm  Infusion Stop Time: 1225 pm  Post Infusion IV Care: Observation period completed and Peripheral IV Discontinued  Discharge: Condition: Good, Destination: Home . AVS Declined  Performed by:  Mayme Spearman, RN

## 2023-08-07 ENCOUNTER — Telehealth: Payer: Self-pay | Admitting: *Deleted

## 2023-08-07 NOTE — Telephone Encounter (Signed)
 LVM for patient to return call  to [phone] regarding scheduling mammogram / waiting for call back.

## 2023-08-14 NOTE — Progress Notes (Signed)
 Established Patient Office Visit  Subjective   Patient ID: Angela Huerta, female    DOB: November 01, 1970  Age: 53 y.o. MRN: [id number]  No chief complaint on file.   HPI  Angela Huerta is a 53yof with a PMHx of iron deficiency anemia, hypothyroidism, and hyperlipidemia. She is presenting today for follow up after iron infusion on 07/27/23. Last Hgb was 7.4.  Subjective:  Menstrual history: frequency, duration, volume, presence of clots Taking PO Iron? Fatigue? Weakness? DOE? Palpitations? Pica?  ROS: hematochezia, hematemesis, melena  Specific diet such as vegetarian or vegan?  PO Iron?  Exam: check for blue sclera, tachycardia   {History (Optional):23778}  ROS    Objective:     There were no vitals taken for this visit. {Vitals History (Optional):23777}  Physical Exam Vitals reviewed.  Constitutional:      General: She is not in acute distress.    Appearance: Normal appearance. She is normal weight. She is not ill-appearing, toxic-appearing or diaphoretic.  HENT:     Head: Normocephalic and atraumatic.     Mouth/Throat:     Mouth: Mucous membranes are moist.     Pharynx: Oropharynx is clear. No oropharyngeal exudate or posterior oropharyngeal erythema.   Eyes:     General: No scleral icterus.       Right eye: No discharge.        Left eye: No discharge.     Extraocular Movements: Extraocular movements intact.     Conjunctiva/sclera: Conjunctivae normal.     Pupils: Pupils are equal, round, and reactive to light.    Cardiovascular:     Rate and Rhythm: Normal rate and regular rhythm.     Pulses: Normal pulses.     Heart sounds: Normal heart sounds. No murmur heard.    No friction rub. No gallop.  Pulmonary:     Effort: Pulmonary effort is normal. No respiratory distress.     Breath sounds: Normal breath sounds. No stridor. No wheezing, rhonchi or rales.  Abdominal:     General: Abdomen is flat. There is no distension.     Palpations: Abdomen is  soft. There is no mass.     Tenderness: There is no abdominal tenderness. There is no right CVA tenderness, left CVA tenderness, guarding or rebound.     Hernia: No hernia is present.   Musculoskeletal:        General: No swelling, tenderness, deformity or signs of injury.     Right lower leg: No edema.     Left lower leg: No edema.   Skin:    General: Skin is warm and dry.     Capillary Refill: Capillary refill takes less than 2 seconds.     Coloration: Skin is not jaundiced or pale.     Findings: No bruising, erythema, lesion or rash.   Neurological:     General: No focal deficit present.     Mental Status: She is alert and oriented to person, place, and time. Mental status is at baseline.     Motor: No weakness.     Gait: Gait normal.   Psychiatric:        Mood and Affect: Mood normal.        Behavior: Behavior normal.      No results found for any visits on 08/15/23.  {Labs (Optional):23779}  The 10-year ASCVD risk score (Arnett DK, et al., 2019) is: 2.2%    Assessment & Plan:   Problem List Items Addressed This Visit  Endocrine   Hypothyroidism   -Last TSH normal on 12/28/22 -Continue 75 mcg synthroid         Other   Health care maintenance   -Follows with Dr. Fogleman for GYN care; well woman exam scheduled for 7/15; will complete mammogram + pap smear then       Iron deficiency anemia due to chronic blood loss - Primary   -per Dr. Makenzee' note - unclear etiology, has been a chronic problem -baseline Hgb is 10.7 -last CBC on 07/17/23 showed Hgb of 7.4, MCV of 70 -iron studies on 07/13/23 shows decreased % saturation TO 4% -s/p iron infusion 07/27/23 -colonoscopy last completed 11/2020 showing only 2 polyps that were removed -PLAN: reorder CBC, consider oral iron supplementation       No follow-ups on file.       (Add signature) dot dxt

## 2023-08-14 NOTE — Assessment & Plan Note (Deleted)
-  Last TSH normal on 12/28/22 -Continue 75 mcg synthroid

## 2023-08-14 NOTE — Assessment & Plan Note (Addendum)
-  Follows with Dr. Fogleman for GYN care; well woman exam scheduled for 7/15; will complete mammogram + pap smear then  -Offered pt Shingrix but she declined -Pt amenable to TDAP but wants to wait for next appt.

## 2023-08-14 NOTE — Assessment & Plan Note (Signed)
-  per Dr. Collins' note - unclear etiology, has been a chronic problem -baseline Hgb is 10.7 -last CBC on 07/17/23 showed Hgb of 7.4, MCV of 70 -iron studies on 07/13/23 shows decreased % saturation TO 4% -s/p iron infusion 07/27/23 -colonoscopy last completed 11/2020 showing only 2 polyps that were removed -PLAN: reorder CBC, consider oral iron supplementation

## 2023-08-15 ENCOUNTER — Ambulatory Visit (INDEPENDENT_AMBULATORY_CARE_PROVIDER_SITE_OTHER)

## 2023-08-15 VITALS — BP 167/99 | HR 50 | Ht 65.0 in | Wt 167.0 lb

## 2023-08-15 DIAGNOSIS — R030 Elevated blood-pressure reading, without diagnosis of hypertension: Secondary | ICD-10-CM

## 2023-08-15 DIAGNOSIS — D500 Iron deficiency anemia secondary to blood loss (chronic): Secondary | ICD-10-CM

## 2023-08-15 DIAGNOSIS — E039 Hypothyroidism, unspecified: Secondary | ICD-10-CM

## 2023-08-15 DIAGNOSIS — Z0000 Encounter for general adult medical examination without abnormal findings: Secondary | ICD-10-CM

## 2023-08-15 DIAGNOSIS — L308 Other specified dermatitis: Secondary | ICD-10-CM

## 2023-08-15 MED ORDER — TRIAMCINOLONE ACETONIDE 0.1 % EX CREA: 1.0000 | TOPICAL_CREAM | Freq: Two times a day (BID) | CUTANEOUS | 0 refills | Status: AC

## 2023-08-15 NOTE — Patient Instructions (Addendum)
 Today we talked about your low iron levels. We will check your labs and I will send you a MyChart message with the results.  Please buy a blood pressure cuff to check your blood pressure at home and send me the results.  I have ordered your triamcinolone  cream.

## 2023-08-15 NOTE — Assessment & Plan Note (Signed)
-  pt requesting refill for her triamcinolone  cream

## 2023-08-15 NOTE — Assessment & Plan Note (Addendum)
-  BP consistently elevated during [facility] appointments, patient mentions that her systolic bp down to 120s at GYN appointments. -Counseled pt on obtaining home cuff

## 2023-08-15 NOTE — Assessment & Plan Note (Signed)
-  Last TSH normal on 12/28/22 -Continue 75 mcg synthroid

## 2023-08-15 NOTE — Progress Notes (Signed)
 Internal Medicine Clinic Attending  I was physically present during the key portions of the resident provided service and participated in the medical decision making of patient's management care. I reviewed pertinent patient test results.  The assessment, diagnosis, and plan were formulated together and I agree with the documentation in the resident's note.  Lovie Clarity, MD    Agree with checking CBC & iron studies today to assess where IDA is after 2 iron infusions. If not well within normal range, I would opt for more infusions since she is still having vaginal bleeding.

## 2023-08-16 ENCOUNTER — Ambulatory Visit: Payer: Self-pay

## 2023-08-16 ENCOUNTER — Other Ambulatory Visit: Payer: Self-pay

## 2023-08-16 LAB — CBC
Hematocrit: 43.2 % (ref 34.0–46.6)
Hemoglobin: 12.3 g/dL (ref 11.1–15.9)
MCH: 22.4 pg — ABNORMAL LOW (ref 26.6–33.0)
MCHC: 28.5 g/dL — ABNORMAL LOW (ref 31.5–35.7)
MCV: 79 fL (ref 79–97)
Platelets: 250 10*3/uL (ref 150–450)
RBC: 5.5 x10E6/uL — ABNORMAL HIGH (ref 3.77–5.28)
RDW: 27.5 % — ABNORMAL HIGH (ref 11.7–15.4)
WBC: 3.7 10*3/uL (ref 3.4–10.8)

## 2023-08-16 LAB — IRON,TIBC AND FERRITIN PANEL
Ferritin: 199 ng/mL — ABNORMAL HIGH (ref 15–150)
Iron Saturation: 18 % (ref 15–55)
Iron: 52 ug/dL (ref 27–159)
Total Iron Binding Capacity: 297 ug/dL (ref 250–450)
UIBC: 245 ug/dL (ref 131–425)

## 2023-08-16 MED ORDER — FERROUS SULFATE 325 (65 FE) MG PO TABS: 325.0000 mg | ORAL_TABLET | ORAL | 0 refills | Status: DC

## 2023-08-22 ENCOUNTER — Telehealth: Payer: Self-pay | Admitting: *Deleted

## 2023-08-22 NOTE — Telephone Encounter (Signed)
 Called patient left voice message for  patient to call the breast  center to schedule her mammogram or either contact our office at [phone]. The number for the breast center [phone]. Waiting for call back.

## 2023-09-05 ENCOUNTER — Telehealth: Payer: Self-pay | Admitting: *Deleted

## 2023-09-05 NOTE — Telephone Encounter (Signed)
 Called patient Angela Huerta regarding getting mammogram scheduled / patient can contact the breast center directly at [phone] or she can contact me here at the clinic at [phone] to help get scheduled.

## 2023-10-12 ENCOUNTER — Telehealth: Payer: Self-pay | Admitting: *Deleted

## 2023-10-12 NOTE — Telephone Encounter (Signed)
 Mammogram appointment September 30,2025 @ 11:30 am to arrive 11:15am / appointment mailed to the patient breast center.

## 2023-10-17 LAB — HM MAMMOGRAPHY

## 2023-11-08 ENCOUNTER — Other Ambulatory Visit: Payer: Self-pay

## 2023-11-08 DIAGNOSIS — E039 Hypothyroidism, unspecified: Secondary | ICD-10-CM

## 2023-11-08 MED ORDER — LEVOTHYROXINE SODIUM 75 MCG PO TABS: 75.0000 ug | ORAL_TABLET | Freq: Every day | ORAL | 0 refills | Status: DC

## 2023-11-08 NOTE — Telephone Encounter (Signed)
 Medication sent to pharmacy

## 2023-11-14 ENCOUNTER — Ambulatory Visit

## 2024-02-13 ENCOUNTER — Other Ambulatory Visit: Payer: Self-pay | Admitting: Internal Medicine

## 2024-02-13 DIAGNOSIS — E039 Hypothyroidism, unspecified: Secondary | ICD-10-CM

## 2024-02-14 NOTE — Telephone Encounter (Signed)
 Patient last seen 08/15/23.I called the patient to schedule a appointment. Unable to reach the patient. I lvm for her to give us  a call back.
